# Patient Record
Sex: Female | Born: 1950
Health system: Southern US, Community
[De-identification: ages and names within clinical notes are randomized; demographics above are authoritative.]

## PROBLEM LIST (undated history)

## (undated) DIAGNOSIS — I10 Essential (primary) hypertension: Secondary | ICD-10-CM

## (undated) DIAGNOSIS — F419 Anxiety disorder, unspecified: Secondary | ICD-10-CM

## (undated) HISTORY — DX: Anxiety disorder, unspecified: F41.9

## (undated) HISTORY — PX: OTHER SURGICAL HISTORY: SHX169

## (undated) HISTORY — DX: Essential (primary) hypertension: I10

---

## 1998-06-27 ENCOUNTER — Other Ambulatory Visit: Admission: RE | Admit: 1998-06-27 | Discharge: 1998-06-27 | Payer: Self-pay | Admitting: Obstetrics and Gynecology

## 2000-04-18 ENCOUNTER — Other Ambulatory Visit: Admission: RE | Admit: 2000-04-18 | Discharge: 2000-04-18 | Payer: Self-pay | Admitting: Obstetrics and Gynecology

## 2000-05-23 ENCOUNTER — Encounter: Payer: Self-pay | Admitting: *Deleted

## 2000-05-23 ENCOUNTER — Ambulatory Visit (HOSPITAL_COMMUNITY): Admission: RE | Admit: 2000-05-23 | Discharge: 2000-05-23 | Payer: Self-pay | Admitting: *Deleted

## 2001-08-15 ENCOUNTER — Other Ambulatory Visit: Admission: RE | Admit: 2001-08-15 | Discharge: 2001-08-15 | Payer: Self-pay | Admitting: Obstetrics and Gynecology

## 2001-11-22 ENCOUNTER — Encounter: Payer: Self-pay | Admitting: *Deleted

## 2001-11-22 ENCOUNTER — Ambulatory Visit (HOSPITAL_COMMUNITY): Admission: RE | Admit: 2001-11-22 | Discharge: 2001-11-22 | Payer: Self-pay | Admitting: *Deleted

## 2003-06-02 ENCOUNTER — Emergency Department (HOSPITAL_COMMUNITY): Admission: EM | Admit: 2003-06-02 | Discharge: 2003-06-03 | Payer: Self-pay | Admitting: Emergency Medicine

## 2003-06-11 ENCOUNTER — Ambulatory Visit (HOSPITAL_COMMUNITY): Admission: RE | Admit: 2003-06-11 | Discharge: 2003-06-11 | Payer: Self-pay | Admitting: Orthopedic Surgery

## 2003-06-12 ENCOUNTER — Ambulatory Visit (HOSPITAL_COMMUNITY): Admission: RE | Admit: 2003-06-12 | Discharge: 2003-06-12 | Payer: Self-pay | Admitting: Orthopedic Surgery

## 2003-07-12 ENCOUNTER — Ambulatory Visit (HOSPITAL_COMMUNITY): Admission: RE | Admit: 2003-07-12 | Discharge: 2003-07-12 | Payer: Self-pay | Admitting: Family Medicine

## 2003-08-05 ENCOUNTER — Encounter: Admission: RE | Admit: 2003-08-05 | Discharge: 2003-08-05 | Payer: Self-pay | Admitting: Orthopaedic Surgery

## 2004-03-16 ENCOUNTER — Other Ambulatory Visit: Admission: RE | Admit: 2004-03-16 | Discharge: 2004-03-16 | Payer: Self-pay | Admitting: Obstetrics and Gynecology

## 2005-02-04 ENCOUNTER — Ambulatory Visit (HOSPITAL_COMMUNITY): Admission: RE | Admit: 2005-02-04 | Discharge: 2005-02-04 | Payer: Self-pay | Admitting: Internal Medicine

## 2005-05-13 ENCOUNTER — Other Ambulatory Visit: Admission: RE | Admit: 2005-05-13 | Discharge: 2005-05-13 | Payer: Self-pay | Admitting: Obstetrics and Gynecology

## 2006-01-20 ENCOUNTER — Ambulatory Visit (HOSPITAL_COMMUNITY): Admission: RE | Admit: 2006-01-20 | Discharge: 2006-01-20 | Payer: Self-pay | Admitting: Internal Medicine

## 2017-10-17 ENCOUNTER — Other Ambulatory Visit (HOSPITAL_COMMUNITY): Payer: Self-pay | Admitting: Internal Medicine

## 2017-10-17 ENCOUNTER — Ambulatory Visit (HOSPITAL_COMMUNITY)
Admission: RE | Admit: 2017-10-17 | Discharge: 2017-10-17 | Disposition: A | Discharge status: Home / Self Care | Payer: Medicare Other | Source: Ambulatory Visit | Attending: Family | Admitting: Family

## 2017-10-17 DIAGNOSIS — R0989 Other specified symptoms and signs involving the circulatory and respiratory systems: Secondary | ICD-10-CM

## 2018-12-14 ENCOUNTER — Other Ambulatory Visit: Payer: Self-pay

## 2018-12-14 DIAGNOSIS — Z20822 Contact with and (suspected) exposure to covid-19: Secondary | ICD-10-CM

## 2018-12-17 LAB — NOVEL CORONAVIRUS, NAA: SARS-CoV-2, NAA: NOT DETECTED

## 2019-02-17 ENCOUNTER — Ambulatory Visit: Payer: Medicare PPO | Attending: Internal Medicine

## 2019-02-17 DIAGNOSIS — Z23 Encounter for immunization: Secondary | ICD-10-CM | POA: Insufficient documentation

## 2019-02-17 NOTE — Progress Notes (Signed)
   Covid-19 Vaccination Clinic  Name:  Victoria Bradley    MRN: [id number] DOB: 06-04-1950  02/17/2019  [name] was observed post Covid-19 immunization for 15 minutes without incidence. She was provided with Vaccine Information Sheet and instruction to access the V-Safe system.   [name] was instructed to call 911 with any severe reactions post vaccine: Marland Kitchen Difficulty breathing  . Swelling of your face and throat  . A fast heartbeat  . A bad rash all over your body  . Dizziness and weakness    Immunizations Administered    Name Date Dose VIS Date Route   Pfizer COVID-19 Vaccine 02/17/2019  2:00 PM 0.3 mL 01/05/2019 Intramuscular   Manufacturer: [employer], [employer]   Lot: [id number]   NDC: [address]-2

## 2019-03-10 ENCOUNTER — Ambulatory Visit: Payer: Medicare PPO | Attending: Internal Medicine

## 2019-03-10 DIAGNOSIS — Z23 Encounter for immunization: Secondary | ICD-10-CM

## 2019-03-10 NOTE — Progress Notes (Signed)
   Covid-19 Vaccination Clinic  Name:  Victoria Bradley    MRN: [id number] DOB: 1950/12/07  03/10/2019  [name] was observed post Covid-19 immunization for 15 minutes without incidence. She was provided with Vaccine Information Sheet and instruction to access the V-Safe system.   [name] was instructed to call 911 with any severe reactions post vaccine: Marland Kitchen Difficulty breathing  . Swelling of your face and throat  . A fast heartbeat  . A bad rash all over your body  . Dizziness and weakness    Immunizations Administered    Name Date Dose VIS Date Route   Pfizer COVID-19 Vaccine 03/10/2019  1:08 PM 0.3 mL 01/05/2019 Intramuscular   Manufacturer: [employer], [employer]   Lot: [id number]   NDC: [address]-1

## 2019-04-17 ENCOUNTER — Other Ambulatory Visit: Payer: Self-pay | Admitting: Internal Medicine

## 2019-04-17 DIAGNOSIS — I1310 Hypertensive heart and chronic kidney disease without heart failure, with stage 1 through stage 4 chronic kidney disease, or unspecified chronic kidney disease: Secondary | ICD-10-CM

## 2019-04-17 DIAGNOSIS — R0789 Other chest pain: Secondary | ICD-10-CM

## 2019-05-02 ENCOUNTER — Ambulatory Visit
Admission: RE | Admit: 2019-05-02 | Discharge: 2019-05-02 | Disposition: A | Discharge status: Home / Self Care | Payer: Medicare PPO | Source: Ambulatory Visit | Attending: Internal Medicine | Admitting: Internal Medicine

## 2019-05-02 DIAGNOSIS — R0789 Other chest pain: Secondary | ICD-10-CM

## 2019-05-02 DIAGNOSIS — I1310 Hypertensive heart and chronic kidney disease without heart failure, with stage 1 through stage 4 chronic kidney disease, or unspecified chronic kidney disease: Secondary | ICD-10-CM

## 2019-05-29 DIAGNOSIS — L570 Actinic keratosis: Secondary | ICD-10-CM | POA: Diagnosis not present

## 2019-06-14 DIAGNOSIS — H9193 Unspecified hearing loss, bilateral: Secondary | ICD-10-CM | POA: Diagnosis not present

## 2019-06-14 DIAGNOSIS — H6123 Impacted cerumen, bilateral: Secondary | ICD-10-CM | POA: Diagnosis not present

## 2019-06-14 DIAGNOSIS — W57XXXA Bitten or stung by nonvenomous insect and other nonvenomous arthropods, initial encounter: Secondary | ICD-10-CM | POA: Diagnosis not present

## 2019-06-19 DIAGNOSIS — J309 Allergic rhinitis, unspecified: Secondary | ICD-10-CM | POA: Diagnosis not present

## 2019-06-19 DIAGNOSIS — H9193 Unspecified hearing loss, bilateral: Secondary | ICD-10-CM | POA: Diagnosis not present

## 2019-06-19 DIAGNOSIS — H6123 Impacted cerumen, bilateral: Secondary | ICD-10-CM | POA: Diagnosis not present

## 2019-06-20 DIAGNOSIS — H2513 Age-related nuclear cataract, bilateral: Secondary | ICD-10-CM | POA: Diagnosis not present

## 2019-06-20 DIAGNOSIS — H43811 Vitreous degeneration, right eye: Secondary | ICD-10-CM | POA: Diagnosis not present

## 2019-07-10 DIAGNOSIS — J309 Allergic rhinitis, unspecified: Secondary | ICD-10-CM | POA: Diagnosis not present

## 2019-07-10 DIAGNOSIS — J209 Acute bronchitis, unspecified: Secondary | ICD-10-CM | POA: Diagnosis not present

## 2019-07-10 DIAGNOSIS — R05 Cough: Secondary | ICD-10-CM | POA: Diagnosis not present

## 2019-09-26 DIAGNOSIS — L570 Actinic keratosis: Secondary | ICD-10-CM | POA: Diagnosis not present

## 2019-09-26 DIAGNOSIS — S81852A Open bite, left lower leg, initial encounter: Secondary | ICD-10-CM | POA: Diagnosis not present

## 2019-09-26 DIAGNOSIS — L578 Other skin changes due to chronic exposure to nonionizing radiation: Secondary | ICD-10-CM | POA: Diagnosis not present

## 2019-09-26 DIAGNOSIS — L814 Other melanin hyperpigmentation: Secondary | ICD-10-CM | POA: Diagnosis not present

## 2019-09-26 DIAGNOSIS — D225 Melanocytic nevi of trunk: Secondary | ICD-10-CM | POA: Diagnosis not present

## 2019-09-26 DIAGNOSIS — L859 Epidermal thickening, unspecified: Secondary | ICD-10-CM | POA: Diagnosis not present

## 2019-09-26 DIAGNOSIS — L219 Seborrheic dermatitis, unspecified: Secondary | ICD-10-CM | POA: Diagnosis not present

## 2019-09-26 DIAGNOSIS — L821 Other seborrheic keratosis: Secondary | ICD-10-CM | POA: Diagnosis not present

## 2019-10-15 DIAGNOSIS — R739 Hyperglycemia, unspecified: Secondary | ICD-10-CM | POA: Diagnosis not present

## 2019-10-15 DIAGNOSIS — I10 Essential (primary) hypertension: Secondary | ICD-10-CM | POA: Diagnosis not present

## 2019-10-15 DIAGNOSIS — E785 Hyperlipidemia, unspecified: Secondary | ICD-10-CM | POA: Diagnosis not present

## 2019-10-22 DIAGNOSIS — N182 Chronic kidney disease, stage 2 (mild): Secondary | ICD-10-CM | POA: Diagnosis not present

## 2019-10-22 DIAGNOSIS — R0789 Other chest pain: Secondary | ICD-10-CM | POA: Diagnosis not present

## 2019-10-22 DIAGNOSIS — R809 Proteinuria, unspecified: Secondary | ICD-10-CM | POA: Diagnosis not present

## 2019-10-22 DIAGNOSIS — E785 Hyperlipidemia, unspecified: Secondary | ICD-10-CM | POA: Diagnosis not present

## 2019-10-22 DIAGNOSIS — M25572 Pain in left ankle and joints of left foot: Secondary | ICD-10-CM | POA: Diagnosis not present

## 2019-10-22 DIAGNOSIS — R739 Hyperglycemia, unspecified: Secondary | ICD-10-CM | POA: Diagnosis not present

## 2019-10-22 DIAGNOSIS — I1310 Hypertensive heart and chronic kidney disease without heart failure, with stage 1 through stage 4 chronic kidney disease, or unspecified chronic kidney disease: Secondary | ICD-10-CM | POA: Diagnosis not present

## 2019-10-22 DIAGNOSIS — R82998 Other abnormal findings in urine: Secondary | ICD-10-CM | POA: Diagnosis not present

## 2019-10-22 DIAGNOSIS — Z0000 Encounter for general adult medical examination without abnormal findings: Secondary | ICD-10-CM | POA: Diagnosis not present

## 2019-10-22 DIAGNOSIS — M25571 Pain in right ankle and joints of right foot: Secondary | ICD-10-CM | POA: Diagnosis not present

## 2019-10-22 DIAGNOSIS — R011 Cardiac murmur, unspecified: Secondary | ICD-10-CM | POA: Diagnosis not present

## 2019-10-23 DIAGNOSIS — Z1212 Encounter for screening for malignant neoplasm of rectum: Secondary | ICD-10-CM | POA: Diagnosis not present

## 2019-11-05 DIAGNOSIS — M25571 Pain in right ankle and joints of right foot: Secondary | ICD-10-CM | POA: Diagnosis not present

## 2019-11-08 ENCOUNTER — Other Ambulatory Visit: Payer: Self-pay

## 2019-11-08 ENCOUNTER — Ambulatory Visit (HOSPITAL_COMMUNITY)
Admission: RE | Admit: 2019-11-08 | Discharge: 2019-11-08 | Disposition: A | Discharge status: Home / Self Care | Payer: Medicare PPO | Source: Ambulatory Visit | Attending: Internal Medicine | Admitting: Internal Medicine

## 2019-11-08 ENCOUNTER — Other Ambulatory Visit (HOSPITAL_COMMUNITY): Payer: Self-pay | Admitting: Internal Medicine

## 2019-11-08 DIAGNOSIS — R0989 Other specified symptoms and signs involving the circulatory and respiratory systems: Secondary | ICD-10-CM

## 2019-11-26 DIAGNOSIS — M25571 Pain in right ankle and joints of right foot: Secondary | ICD-10-CM | POA: Diagnosis not present

## 2020-04-21 DIAGNOSIS — F418 Other specified anxiety disorders: Secondary | ICD-10-CM | POA: Diagnosis not present

## 2020-04-21 DIAGNOSIS — I1310 Hypertensive heart and chronic kidney disease without heart failure, with stage 1 through stage 4 chronic kidney disease, or unspecified chronic kidney disease: Secondary | ICD-10-CM | POA: Diagnosis not present

## 2020-04-21 DIAGNOSIS — E785 Hyperlipidemia, unspecified: Secondary | ICD-10-CM | POA: Diagnosis not present

## 2020-04-21 DIAGNOSIS — R011 Cardiac murmur, unspecified: Secondary | ICD-10-CM | POA: Diagnosis not present

## 2020-04-21 DIAGNOSIS — E871 Hypo-osmolality and hyponatremia: Secondary | ICD-10-CM | POA: Diagnosis not present

## 2020-04-21 DIAGNOSIS — R809 Proteinuria, unspecified: Secondary | ICD-10-CM | POA: Diagnosis not present

## 2020-04-21 DIAGNOSIS — N182 Chronic kidney disease, stage 2 (mild): Secondary | ICD-10-CM | POA: Diagnosis not present

## 2020-04-21 DIAGNOSIS — E669 Obesity, unspecified: Secondary | ICD-10-CM | POA: Diagnosis not present

## 2020-04-21 DIAGNOSIS — R739 Hyperglycemia, unspecified: Secondary | ICD-10-CM | POA: Diagnosis not present

## 2020-07-09 DIAGNOSIS — Z1231 Encounter for screening mammogram for malignant neoplasm of breast: Secondary | ICD-10-CM | POA: Diagnosis not present

## 2020-07-09 DIAGNOSIS — Z01419 Encounter for gynecological examination (general) (routine) without abnormal findings: Secondary | ICD-10-CM | POA: Diagnosis not present

## 2020-07-09 DIAGNOSIS — Z6834 Body mass index (BMI) 34.0-34.9, adult: Secondary | ICD-10-CM | POA: Diagnosis not present

## 2020-08-19 DIAGNOSIS — H8110 Benign paroxysmal vertigo, unspecified ear: Secondary | ICD-10-CM | POA: Diagnosis not present

## 2020-09-25 DIAGNOSIS — L814 Other melanin hyperpigmentation: Secondary | ICD-10-CM | POA: Diagnosis not present

## 2020-09-25 DIAGNOSIS — L821 Other seborrheic keratosis: Secondary | ICD-10-CM | POA: Diagnosis not present

## 2020-09-25 DIAGNOSIS — D225 Melanocytic nevi of trunk: Secondary | ICD-10-CM | POA: Diagnosis not present

## 2020-09-25 DIAGNOSIS — L859 Epidermal thickening, unspecified: Secondary | ICD-10-CM | POA: Diagnosis not present

## 2020-09-25 DIAGNOSIS — L570 Actinic keratosis: Secondary | ICD-10-CM | POA: Diagnosis not present

## 2020-09-25 DIAGNOSIS — L578 Other skin changes due to chronic exposure to nonionizing radiation: Secondary | ICD-10-CM | POA: Diagnosis not present

## 2020-10-16 DIAGNOSIS — E785 Hyperlipidemia, unspecified: Secondary | ICD-10-CM | POA: Diagnosis not present

## 2020-10-16 DIAGNOSIS — R739 Hyperglycemia, unspecified: Secondary | ICD-10-CM | POA: Diagnosis not present

## 2020-10-23 DIAGNOSIS — N182 Chronic kidney disease, stage 2 (mild): Secondary | ICD-10-CM | POA: Diagnosis not present

## 2020-10-23 DIAGNOSIS — R809 Proteinuria, unspecified: Secondary | ICD-10-CM | POA: Diagnosis not present

## 2020-10-23 DIAGNOSIS — E785 Hyperlipidemia, unspecified: Secondary | ICD-10-CM | POA: Diagnosis not present

## 2020-10-23 DIAGNOSIS — R011 Cardiac murmur, unspecified: Secondary | ICD-10-CM | POA: Diagnosis not present

## 2020-10-23 DIAGNOSIS — Z23 Encounter for immunization: Secondary | ICD-10-CM | POA: Diagnosis not present

## 2020-10-23 DIAGNOSIS — I1310 Hypertensive heart and chronic kidney disease without heart failure, with stage 1 through stage 4 chronic kidney disease, or unspecified chronic kidney disease: Secondary | ICD-10-CM | POA: Diagnosis not present

## 2020-10-23 DIAGNOSIS — Z1212 Encounter for screening for malignant neoplasm of rectum: Secondary | ICD-10-CM | POA: Diagnosis not present

## 2020-10-23 DIAGNOSIS — R82998 Other abnormal findings in urine: Secondary | ICD-10-CM | POA: Diagnosis not present

## 2020-10-23 DIAGNOSIS — F418 Other specified anxiety disorders: Secondary | ICD-10-CM | POA: Diagnosis not present

## 2020-10-23 DIAGNOSIS — R5383 Other fatigue: Secondary | ICD-10-CM | POA: Diagnosis not present

## 2020-10-23 DIAGNOSIS — E669 Obesity, unspecified: Secondary | ICD-10-CM | POA: Diagnosis not present

## 2020-10-23 DIAGNOSIS — Z0000 Encounter for general adult medical examination without abnormal findings: Secondary | ICD-10-CM | POA: Diagnosis not present

## 2020-10-23 DIAGNOSIS — E871 Hypo-osmolality and hyponatremia: Secondary | ICD-10-CM | POA: Diagnosis not present

## 2020-10-23 DIAGNOSIS — H8110 Benign paroxysmal vertigo, unspecified ear: Secondary | ICD-10-CM | POA: Diagnosis not present

## 2020-11-18 DIAGNOSIS — H8110 Benign paroxysmal vertigo, unspecified ear: Secondary | ICD-10-CM | POA: Diagnosis not present

## 2020-11-18 DIAGNOSIS — I1310 Hypertensive heart and chronic kidney disease without heart failure, with stage 1 through stage 4 chronic kidney disease, or unspecified chronic kidney disease: Secondary | ICD-10-CM | POA: Diagnosis not present

## 2020-11-18 DIAGNOSIS — N182 Chronic kidney disease, stage 2 (mild): Secondary | ICD-10-CM | POA: Diagnosis not present

## 2020-11-18 DIAGNOSIS — R55 Syncope and collapse: Secondary | ICD-10-CM | POA: Diagnosis not present

## 2020-11-18 DIAGNOSIS — R739 Hyperglycemia, unspecified: Secondary | ICD-10-CM | POA: Diagnosis not present

## 2020-11-18 DIAGNOSIS — E871 Hypo-osmolality and hyponatremia: Secondary | ICD-10-CM | POA: Diagnosis not present

## 2020-11-18 DIAGNOSIS — R011 Cardiac murmur, unspecified: Secondary | ICD-10-CM | POA: Diagnosis not present

## 2020-11-19 ENCOUNTER — Other Ambulatory Visit (HOSPITAL_COMMUNITY): Payer: Self-pay | Admitting: Family Medicine

## 2020-11-19 DIAGNOSIS — R011 Cardiac murmur, unspecified: Secondary | ICD-10-CM

## 2020-11-24 DIAGNOSIS — S30861A Insect bite (nonvenomous) of abdominal wall, initial encounter: Secondary | ICD-10-CM | POA: Diagnosis not present

## 2020-11-24 DIAGNOSIS — W57XXXA Bitten or stung by nonvenomous insect and other nonvenomous arthropods, initial encounter: Secondary | ICD-10-CM | POA: Diagnosis not present

## 2020-12-03 ENCOUNTER — Ambulatory Visit (HOSPITAL_COMMUNITY): Payer: Medicare PPO | Attending: Cardiology

## 2020-12-03 ENCOUNTER — Other Ambulatory Visit: Payer: Self-pay

## 2020-12-03 DIAGNOSIS — R011 Cardiac murmur, unspecified: Secondary | ICD-10-CM | POA: Diagnosis not present

## 2020-12-03 LAB — ECHOCARDIOGRAM COMPLETE
AR max vel: 1.79 cm2
AV Area VTI: 2.09 cm2
AV Area mean vel: 1.87 cm2
AV Mean grad: 9 mmHg
AV Peak grad: 20.4 mmHg
Ao pk vel: 2.26 m/s
Area-P 1/2: 6.12 cm2
S' Lateral: 2 cm

## 2020-12-03 MED ORDER — PERFLUTREN LIPID MICROSPHERE
1.0000 mL | INTRAVENOUS | Status: AC | PRN
  Administered 2020-12-03: 3 mL via INTRAVENOUS

## 2020-12-16 DIAGNOSIS — H2513 Age-related nuclear cataract, bilateral: Secondary | ICD-10-CM | POA: Diagnosis not present

## 2020-12-16 DIAGNOSIS — H43813 Vitreous degeneration, bilateral: Secondary | ICD-10-CM | POA: Diagnosis not present

## 2020-12-31 ENCOUNTER — Ambulatory Visit: Payer: Medicare PPO | Admitting: Cardiology

## 2020-12-31 ENCOUNTER — Encounter: Payer: Self-pay | Admitting: Cardiology

## 2020-12-31 ENCOUNTER — Other Ambulatory Visit: Payer: Self-pay

## 2020-12-31 VITALS — BP 147/69 | HR 94 | Temp 98.4°F | Resp 17 | Ht 63.0 in | Wt 197.8 lb

## 2020-12-31 DIAGNOSIS — I10 Essential (primary) hypertension: Secondary | ICD-10-CM

## 2020-12-31 DIAGNOSIS — R55 Syncope and collapse: Secondary | ICD-10-CM | POA: Diagnosis not present

## 2020-12-31 MED ORDER — ATENOLOL 50 MG PO TABS: 50.0000 mg | ORAL_TABLET | Freq: Every day | ORAL | 1 refills | Status: DC

## 2020-12-31 MED ORDER — VALSARTAN 160 MG PO TABS: 160.0000 mg | ORAL_TABLET | Freq: Every day | ORAL | 2 refills | Status: DC

## 2020-12-31 NOTE — Progress Notes (Signed)
Primary Physician/Referring:  Shon Baton, MD  Patient ID: Victoria Bradley, female    DOB: Jun 04, 1950, 70 y.o.   MRN: [id number]  Chief Complaint  Patient presents with   Hypertension   Chronic Kidney Disease   New Patient (Initial Visit)   HPI:    Victoria Bradley  is a 70 y.o. female with history of hypertension, hyperlipidemia, CKD, osteoarthritis, scoliosis, history of rheumatic fever in 1974.  Patient presented to her PCP 11/18/2020 with concerns of presyncopal episode.  Patient's PCP Dr. Shon Baton subsequently ordered echocardiogram which revealed hyperdynamic LV with LVEF 70-75%, mean LVOT gradient of 9 mmHg, and grade 1 diastolic dysfunction.   Patient has had 1 episode of frank syncope several years ago.  Again 2 weeks ago while she was traveling to [place] with her relative in the car, she started feeling bright lights in her eyes, diaphoresis, lightheadedness, also thought she needed to go to the bathroom and had near syncopal spell but eventually after resting for a while the episode passed.  She is now referred to me for evaluation of the same.  Denies chest pain, dyspnea, leg edema, no history of pulmonary embolism.  Otherwise remains active.  Past Medical History:  Diagnosis Date   Hypertension    Past Surgical History:  Procedure Laterality Date   OTHER SURGICAL HISTORY Left    toe repair from arthritis   Family History  Problem Relation Age of Onset   Atrial fibrillation Mother 93   Stroke Mother 48   Esophagitis Father 58   Acute myelogenous leukemia Son        Diagnosed at 19 months old    Social History   Tobacco Use   Smoking status: Never   Smokeless tobacco: Never  Substance Use Topics   Alcohol use: Yes    Alcohol/week: 1.0 standard drink    Types: 1 Cans of beer per week    Comment: occasionally   Marital Status: Married   ROS  Review of Systems  Cardiovascular:  Positive for syncope. Negative for chest pain, dyspnea on exertion and leg  swelling.  Gastrointestinal:  Negative for melena.  Neurological:  Positive for dizziness.   Objective  Blood pressure (!) 147/69, pulse 94, temperature 98.4 F (36.9 C), temperature source Temporal, resp. rate 17, height 5\' 3"  (1.6 m), weight 197 lb 12.8 oz (89.7 kg), SpO2 98 %.  Vitals with BMI 12/31/2020  Height 5\' 3"   Weight 197 lbs 13 oz  BMI [id number]  Systolic Q000111Q  Diastolic 69  Pulse 94    Orthostatic VS for the past 72 hrs (Last 3 readings):  Orthostatic BP Patient Position BP Location Cuff Size Orthostatic Pulse  12/31/20 1141 151/77 Standing Left Arm Large 81  12/31/20 1140 151/78 Sitting Left Arm Large 78  12/31/20 1139 144/69 Supine Left Arm Large 77     Physical Exam Constitutional:      Appearance: She is obese.  Neck:     Vascular: No carotid bruit or JVD.  Cardiovascular:     Rate and Rhythm: Normal rate and regular rhythm.     Pulses: Intact distal pulses.     Heart sounds: Normal heart sounds. No murmur heard.   No gallop.  Pulmonary:     Effort: Pulmonary effort is normal.     Breath sounds: Normal breath sounds.  Abdominal:     General: Bowel sounds are normal.     Palpations: Abdomen is soft.  Musculoskeletal:  General: No swelling.    Laboratory examination:   External labs:   Cholesterol, total 211.000 m 10/16/2020 HDL 83.000 mg 10/16/2020 LDL 116.000 m 10/16/2020 Triglycerides 58.000 mg 10/16/2020  A1C 5.400 % 10/16/2020 TSH 1.880 10/16/2020  Hemoglobin 14.300 g/d 04/02/2019  Creatinine, Serum 0.700 mg/ 04/02/2019 Potassium 4.200 mEq 10/16/2020 ALT (SGPT) 30.000 IU/ 10/16/2020  Allergies   Allergies  Allergen Reactions   Penicillins Rash    Medications Prior to Visit:   Outpatient Medications Prior to Visit  Medication Sig Dispense Refill   simvastatin (ZOCOR) 40 MG tablet Take 1 tablet by mouth at bedtime.     atenolol (TENORMIN) 25 MG tablet Take 12.5 mg by mouth daily.     hydrochlorothiazide (HYDRODIURIL) 25 MG tablet Take 0.5  tablets by mouth daily.     valsartan (DIOVAN) 80 MG tablet Take 1 tablet by mouth daily.     No facility-administered medications prior to visit.   Final Medications at End of Visit    Current Meds  Medication Sig   simvastatin (ZOCOR) 40 MG tablet Take 1 tablet by mouth at bedtime.   [DISCONTINUED] atenolol (TENORMIN) 25 MG tablet Take 12.5 mg by mouth daily.   [DISCONTINUED] hydrochlorothiazide (HYDRODIURIL) 25 MG tablet Take 0.5 tablets by mouth daily.   [DISCONTINUED] valsartan (DIOVAN) 80 MG tablet Take 1 tablet by mouth daily.   Radiology:   No results found.  Cardiac Studies:   Carotid artery duplex 2019-11-25: Summary:  Velocities in the right ICA are consistent with a 1-39% stenosis.  Left Carotid: There is no evidence of stenosis in the left ICA. Vertebrals:  Bilateral vertebral arteries demonstrate antegrade flow. Subclavians: Normal flow hemodynamics were seen in bilateral subclavian arteries.  Echocardiogram 12/03/2020: 1. Hyperdynamic contraction with mean LVOT gradient of 9 mmHg (murmur). Left ventricular ejection fraction, by estimation, is 70 to 75%. The left ventricle has hyperdynamic function. The left ventricle has no regional wall motion abnormalities. Left  ventricular diastolic parameters are consistent with Grade I diastolic dysfunction (impaired relaxation).  2. Right ventricular systolic function is normal. The right ventricular size is normal.  3. The mitral valve is normal in structure. No evidence of mitral valve regurgitation. No evidence of mitral stenosis.  4. The aortic valve is tricuspid. Aortic valve regurgitation is not visualized. Mild aortic valve sclerosis is present, with no evidence of aortic valve stenosis. Aortic valve mean gradient measures 9.0 mmHg. Aortic valve Vmax measures 2.26 m/s.  5. The inferior vena cava is normal in size with greater than 50% respiratory variability, suggesting right atrial pressure of 3 mmHg.  EKG:   EKG  12/31/2020: Normal sinus rhythm at the rate of 77 bpm, normal axis, poor R wave progression, probably normal variant.  No evidence of ischemia.    Assessment     ICD-10-CM   1. Vasovagal syncope  R55     2. Primary hypertension  I10 EKG 12-Lead    atenolol (TENORMIN) 50 MG tablet    valsartan (DIOVAN) 160 MG tablet      Medications Discontinued During This Encounter  Medication Reason   hydrochlorothiazide (HYDRODIURIL) 25 MG tablet Discontinued by provider   valsartan (DIOVAN) 80 MG tablet Reorder   atenolol (TENORMIN) 25 MG tablet Reorder    Meds ordered this encounter  Medications   atenolol (TENORMIN) 50 MG tablet    Sig: Take 1 tablet (50 mg total) by mouth daily.    Dispense:  90 tablet    Refill:  1   valsartan (DIOVAN)  160 MG tablet    Sig: Take 1 tablet (160 mg total) by mouth daily.    Dispense:  30 tablet    Refill:  2   Recommendations:   Ece Cumberland is a 70 y.o. Caucasian female patient with primary hypertension, who I had last seen in January 2018 when she presented with atypical chest pain and I had recommended routine treadmill exercise stress test.  As she was very active at that time, after reassurance, she had canceled her stress test.  She is now referred to me for evaluation of near syncopal episodes.   Her symptoms of near syncope are classic for vasovagal episodes. Counterpressure maneuvers for prevention of near syncope discussed with the patient.  I would like to take her off of diuretics.  We will increase beta-blockers, she is on atenolol 12.5 mg daily started about 2 weeks ago, will increase to 50 mg daily in view of hyperdynamic LVEF and dynamic LVOT obstruction to improve her diastolic compliance.  It will also help with vasovagal episodes which is usually related to carotid hypersensitivity as well in this age group.  To improve diastolic function, will also increase valsartan to 160 mg daily.  I would like to see her back in 6 weeks for  follow-up.     Yates Decamp, MD, [facility] 12/31/2020, 1:09 PM Office: [phone] Fax: [phone] Pager: [phone]

## 2021-01-07 ENCOUNTER — Ambulatory Visit: Payer: Medicare PPO | Admitting: Neurology

## 2021-01-08 ENCOUNTER — Ambulatory Visit: Payer: Medicare PPO | Admitting: Neurology

## 2021-01-08 ENCOUNTER — Encounter: Payer: Self-pay | Admitting: Neurology

## 2021-01-08 ENCOUNTER — Other Ambulatory Visit: Payer: Self-pay

## 2021-01-08 VITALS — BP 161/83 | HR 81 | Ht 63.0 in | Wt 190.0 lb

## 2021-01-08 DIAGNOSIS — R55 Syncope and collapse: Secondary | ICD-10-CM | POA: Diagnosis not present

## 2021-01-08 NOTE — Patient Instructions (Signed)
Continue current medications  Follow up with your primary care doctor  Return if worse  

## 2021-01-08 NOTE — Progress Notes (Signed)
[facility]  PATIENT: Victoria Bradley DOB: 1950/08/23  REQUESTING CLINICIAN: Creola Corn, MD HISTORY FROM: Patient  REASON FOR VISIT: Presyncopal episode    HISTORICAL  CHIEF COMPLAINT:  Chief Complaint  Patient presents with   New Patient (Initial Visit)    NP/Paper Arther Dames MD [facility] /[facility]/? Syncope Rm 13    HISTORY OF PRESENT ILLNESS:  This is a 70 year old woman with past medical history of hypertension, hyperlipidemia, and arthritis who is presenting after a near syncopal episode.  Patient said that on the morning of October 23 she was driving to go to see his grand child play in [place].  That morning she did not have breakfast but took her blood pressure medications.  She reports driving which her daughter-in-law.  On the road she started feeling lightheaded, seeing stars and her ears started ringing, she felt queasy but did not vomit.  After a few minutes the symptoms did not go away so she stopped at a [employer] on the road.  She was able to go inside and laid down on the floor.  She did not call 911 but was able to drink some fluid and waited for 20-minutes and the symptoms subsided. She said she was sweating profusely during this time, no reported abnormal movements, no urinary incontinence.  She was able to make it and watch her grandchild play and was doing fine the rest of the day.   She did follow with a cardiologist who diagnosed her with vasovagal presyncope, and he did make some changes to her blood pressure medications. He discontinued diuretic and increase her beta-blocker.  Patient denies any additional symptoms since the change in blood pressure medication.    She reported in 2016, she had a very similar episode while at school, she was able to make it to her teachers lounge, the school nurse took her blood pressure which was 80/40, she was also noted to be dehydrated.  Patient stated symptoms resolved after  drinking lots of fluid.      OTHER MEDICAL CONDITIONS: HTN, Arthritis, HLD   REVIEW OF SYSTEMS: Full 14 system review of systems performed and negative with exception of: as noted in the HPI  ALLERGIES: Allergies  Allergen Reactions   Penicillins Rash    HOME MEDICATIONS: Outpatient Medications Prior to Visit  Medication Sig Dispense Refill   atenolol (TENORMIN) 50 MG tablet Take 1 tablet (50 mg total) by mouth daily. 90 tablet 1   Cholecalciferol (VITAMIN D3) 100000 UNIT/GM POWD Take 2 capsules po q daily     simvastatin (ZOCOR) 40 MG tablet Take 1 tablet by mouth at bedtime.     valsartan (DIOVAN) 160 MG tablet Take 1 tablet (160 mg total) by mouth daily. 30 tablet 2   No facility-administered medications prior to visit.    PAST MEDICAL HISTORY: Past Medical History:  Diagnosis Date   Hypertension     PAST SURGICAL HISTORY: Past Surgical History:  Procedure Laterality Date   OTHER SURGICAL HISTORY Left    toe repair from arthritis    FAMILY HISTORY: Family History  Problem Relation Age of Onset   Atrial fibrillation Mother 39   Stroke Mother 77   Esophagitis Father 73   Acute myelogenous leukemia Son        Diagnosed at 53 months old    SOCIAL HISTORY: Social History   Socioeconomic History   Marital status: Married    Spouse name: Not on file   Number of children:  4   Years of education: Not on file   Highest education level: Not on file  Occupational History   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, site    Comment: Retired at 6  Tobacco Use   Smoking status: Never   Smokeless tobacco: Never  Vaping Use   Vaping Use: Never used  Substance and Sexual Activity   Alcohol use: Yes    Alcohol/week: 1.0 standard drink    Types: 1 Cans of beer per week    Comment: occasionally   Drug use: Never   Sexual activity: Not on file  Other Topics Concern   Not on file  Social History Narrative   Not on file   Social Determinants of Health   Financial  Resource Strain: Not on file  Food Insecurity: Not on file  Transportation Needs: Not on file  Physical Activity: Not on file  Stress: Not on file  Social Connections: Not on file  Intimate Partner Violence: Not on file    PHYSICAL EXAM  GENERAL EXAM/CONSTITUTIONAL: Vitals:  Vitals:   01/08/21 0938  BP: (!) 161/83  Pulse: 81  Weight: 190 lb (86.2 kg)  Height: 5\' 3"  (1.6 m)   Body mass index is 33.66 kg/m. Wt Readings from Last 3 Encounters:  01/08/21 190 lb (86.2 kg)  12/31/20 197 lb 12.8 oz (89.7 kg)   Patient is in no distress; well developed, nourished and groomed; neck is supple  CARDIOVASCULAR: Examination of carotid arteries is normal; no carotid bruits Regular rate and rhythm, no murmurs Examination of peripheral vascular system by observation and palpation is normal  EYES: Pupils round and reactive to light, Visual fields full to confrontation, Extraocular movements intacts,   MUSCULOSKELETAL: Gait, strength, tone, movements noted in Neurologic exam below  NEUROLOGIC: MENTAL STATUS:  No flowsheet data found. awake, alert, oriented to person, place and time recent and remote memory intact normal attention and concentration language fluent, comprehension intact, naming intact fund of knowledge appropriate  CRANIAL NERVE:  2nd, 3rd, 4th, 6th - pupils equal and reactive to light, visual fields full to confrontation, extraocular muscles intact, no nystagmus 5th - facial sensation symmetric 7th - facial strength symmetric 8th - hearing intact 9th - palate elevates symmetrically, uvula midline 11th - shoulder shrug symmetric 12th - tongue protrusion midline  MOTOR:  normal bulk and tone, full strength in the BUE, BLE  SENSORY:  normal and symmetric to light touch, pinprick, temperature, vibration  COORDINATION:  finger-nose-finger, fine finger movements normal  REFLEXES:  deep tendon reflexes present and symmetric  GAIT/STATION:   normal  DIAGNOSTIC DATA (LABS, IMAGING, TESTING) - I reviewed patient records, labs, notes, testing and imaging myself where available.  No results found for: WBC, HGB, HCT, MCV, PLT No results found for: NA, K, CL, CO2, GLUCOSE, BUN, CREATININE, CALCIUM, PROT, ALBUMIN, AST, ALT, ALKPHOS, BILITOT, GFRNONAA, GFRAA No results found for: CHOL, HDL, LDLCALC, LDLDIRECT, TRIG, CHOLHDL No results found for: 14/07/22 No results found for: VITAMINB12 No results found for: TSH     ASSESSMENT AND PLAN  70 y.o. year old female with hypertension, hyperlipidemia and arthritis who is presenting after 1 episode of near syncope.  Patient did not have any breakfast that morning and took her BP meds which include beta-blocker and diuretics at that time.  She denies any loss of awareness or loss of consciousness.  There were no abnormal movements noted, and there was no urinary or bowel incontinence.  Again episode is likely vasovagal in nature.  She  reports no additional symptoms since her cardiologist discontinued the diuretic and increase her beta-blocker.  I advised her to follow-up with her primary care doctor and cardiologist as scheduled and to return if worse.   1. Vasovagal near-syncope     PLAN: Continue current medications  Follow up with your primary care doctor  Return if worse    No orders of the defined types were placed in this encounter.   No orders of the defined types were placed in this encounter.   Return if symptoms worsen or fail to improve.    Windell Norfolk, MD 01/08/2021, 11:27 AM  [facility] [address], [address] [place], [place] [address] [phone]

## 2021-01-22 ENCOUNTER — Telehealth: Payer: Self-pay

## 2021-01-22 NOTE — Telephone Encounter (Signed)
Continue to watch and the medication I am hopeful will prevent syncope also. If BP drops frequently too <90 to cut medication in half. Can we sent My Chart message???

## 2021-01-23 NOTE — Telephone Encounter (Signed)
Called pt to inform her about the message above

## 2021-02-12 ENCOUNTER — Encounter: Payer: Self-pay | Admitting: Cardiology

## 2021-02-12 ENCOUNTER — Other Ambulatory Visit: Payer: Self-pay

## 2021-02-12 ENCOUNTER — Ambulatory Visit: Payer: Medicare PPO | Admitting: Cardiology

## 2021-02-12 VITALS — BP 135/79 | HR 62 | Temp 97.7°F | Resp 17 | Ht 63.0 in | Wt 193.8 lb

## 2021-02-12 DIAGNOSIS — E7800 Pure hypercholesterolemia, unspecified: Secondary | ICD-10-CM

## 2021-02-12 DIAGNOSIS — R55 Syncope and collapse: Secondary | ICD-10-CM | POA: Diagnosis not present

## 2021-02-12 DIAGNOSIS — I10 Essential (primary) hypertension: Secondary | ICD-10-CM | POA: Diagnosis not present

## 2021-02-12 NOTE — Progress Notes (Signed)
Primary Physician/Referring:  Shon Baton, MD  Patient ID: Victoria Bradley, female    DOB: 25-Jul-1950, 71 y.o.   MRN: [id number]  Chief Complaint  Patient presents with   Follow-up    6 WEEKS   Hypertension   Dizziness   ORTHOSTATICS   HPI:    Victoria Bradley  is a 71 y.o. fCaucasian female patient with primary hypertension, who I had last seen in January 2018 when she presented with atypical chest pain and I had recommended routine treadmill exercise stress test.  As she was very active at that time, after reassurance, she had canceled her stress test.  She was referred back to me about 6 weeks ago for evaluation of syncope, I started her on increased dose of atenolol at 50 mg daily.  She was on 12.5 mg daily.  Denies chest pain, dyspnea, leg edema, no history of pulmonary embolism.  Otherwise remains active.  No new symptoms, she has not had any syncope.  On her last office visit had suspected vasovagal syncope and I increase the dose of the atenolol.  Past Medical History:  Diagnosis Date   Hypertension    Past Surgical History:  Procedure Laterality Date   OTHER SURGICAL HISTORY Left    toe repair from arthritis   Family History  Problem Relation Age of Onset   Atrial fibrillation Mother 63   Stroke Mother 80   Esophagitis Father 33   Acute myelogenous leukemia Son        Diagnosed at 65 months old    Social History   Tobacco Use   Smoking status: Never   Smokeless tobacco: Never  Substance Use Topics   Alcohol use: Yes    Alcohol/week: 1.0 standard drink    Types: 1 Cans of beer per week    Comment: occasionally   Marital Status: Married   ROS  Review of Systems  Cardiovascular:  Negative for chest pain, dyspnea on exertion and leg swelling.  Gastrointestinal:  Negative for melena.   Objective  Blood pressure 135/79, pulse 62, temperature 97.7 F (36.5 C), temperature source Temporal, resp. rate 17, height 5\' 3"  (1.6 m), weight 193 lb 12.8 oz (87.9 kg), SpO2 99  %.  Vitals with BMI 02/12/2021 01/08/2021 12/31/2020  Height 5\' 3"  5\' 3"  5\' 3"   Weight 193 lbs 13 oz 190 lbs 197 lbs 13 oz  BMI 34.34 [id number]  Systolic A999333 Q000111Q Q000111Q  Diastolic 79 83 69  Pulse 62 81 94    Orthostatic VS for the past 72 hrs (Last 3 readings):  Orthostatic BP Patient Position BP Location Cuff Size Orthostatic Pulse  02/12/21 1012 133/67 Standing Left Arm Normal 58  02/12/21 1011 136/57 Sitting Left Arm Normal 56  02/12/21 1010 133/54 Supine Left Arm Normal 53     Physical Exam Constitutional:      Appearance: She is obese.  Neck:     Vascular: No carotid bruit or JVD.  Cardiovascular:     Rate and Rhythm: Normal rate and regular rhythm.     Pulses: Intact distal pulses.     Heart sounds: Normal heart sounds. No murmur heard.   No gallop.  Pulmonary:     Effort: Pulmonary effort is normal.     Breath sounds: Normal breath sounds.  Abdominal:     General: Bowel sounds are normal.     Palpations: Abdomen is soft.  Musculoskeletal:     Right lower leg: No edema.     Left lower  leg: No edema.    Laboratory examination:   External labs:   Cholesterol, total 211.000 m 10/16/2020 HDL 83.000 mg 10/16/2020 LDL 116.000 m 10/16/2020 Triglycerides 58.000 mg 10/16/2020  A1C 5.400 % 10/16/2020 TSH 1.880 10/16/2020  Hemoglobin 14.300 g/d 04/02/2019  Creatinine, Serum 0.700 mg/ 04/02/2019 Potassium 4.200 mEq 10/16/2020 ALT (SGPT) 30.000 IU/ 10/16/2020  Allergies   Allergies  Allergen Reactions   Penicillins Rash    Medications Prior to Visit:   Outpatient Medications Prior to Visit  Medication Sig Dispense Refill   ALPRAZolam (XANAX) 0.5 MG tablet Take 1 tablet by mouth as needed.     cetirizine (ZYRTEC) 10 MG tablet Take 1 tablet by mouth as needed.     Cholecalciferol (VITAMIN D3) 100000 UNIT/GM POWD Take 2 capsules po q daily     fluticasone (FLONASE) 50 MCG/ACT nasal spray Place 1 spray into both nostrils as needed.     simvastatin (ZOCOR) 40 MG tablet Take  1 tablet by mouth at bedtime.     valsartan (DIOVAN) 160 MG tablet Take 1 tablet (160 mg total) by mouth daily. 30 tablet 2   atenolol (TENORMIN) 50 MG tablet Take 1 tablet (50 mg total) by mouth daily. 90 tablet 1   atenolol (TENORMIN) 25 MG tablet Take 1 tablet (25 mg total) by mouth daily.     No facility-administered medications prior to visit.   Final Medications at End of Visit    Current Meds  Medication Sig   ALPRAZolam (XANAX) 0.5 MG tablet Take 1 tablet by mouth as needed.   cetirizine (ZYRTEC) 10 MG tablet Take 1 tablet by mouth as needed.   Cholecalciferol (VITAMIN D3) 100000 UNIT/GM POWD Take 2 capsules po q daily   fluticasone (FLONASE) 50 MCG/ACT nasal spray Place 1 spray into both nostrils as needed.   simvastatin (ZOCOR) 40 MG tablet Take 1 tablet by mouth at bedtime.   valsartan (DIOVAN) 160 MG tablet Take 1 tablet (160 mg total) by mouth daily.   [DISCONTINUED] atenolol (TENORMIN) 50 MG tablet Take 1 tablet (50 mg total) by mouth daily.   Radiology:   No results found.  Cardiac Studies:   Carotid artery duplex November 26, 2019: Summary:  Velocities in the right ICA are consistent with a 1-39% stenosis.  Left Carotid: There is no evidence of stenosis in the left ICA. Vertebrals:  Bilateral vertebral arteries demonstrate antegrade flow. Subclavians: Normal flow hemodynamics were seen in bilateral subclavian arteries.  Echocardiogram 12/03/2020: 1. Hyperdynamic contraction with mean LVOT gradient of 9 mmHg (murmur). Left ventricular ejection fraction, by estimation, is 70 to 75%. The left ventricle has hyperdynamic function. The left ventricle has no regional wall motion abnormalities. Left  ventricular diastolic parameters are consistent with Grade I diastolic dysfunction (impaired relaxation).  2. Right ventricular systolic function is normal. The right ventricular size is normal.  3. The mitral valve is normal in structure. No evidence of mitral valve regurgitation. No  evidence of mitral stenosis.  4. The aortic valve is tricuspid. Aortic valve regurgitation is not visualized. Mild aortic valve sclerosis is present, with no evidence of aortic valve stenosis. Aortic valve mean gradient measures 9.0 mmHg. Aortic valve Vmax measures 2.26 m/s.  5. The inferior vena cava is normal in size with greater than 50% respiratory variability, suggesting right atrial pressure of 3 mmHg.  EKG:   EKG 12/31/2020: Normal sinus rhythm at the rate of 77 bpm, normal axis, poor R wave progression, probably normal variant.  No evidence of ischemia.  Assessment     ICD-10-CM   1. Vasovagal syncope  R55 atenolol (TENORMIN) 25 MG tablet    2. Primary hypertension  I10 atenolol (TENORMIN) 25 MG tablet    3. Hypercholesteremia  E78.00       Medications Discontinued During This Encounter  Medication Reason   atenolol (TENORMIN) 50 MG tablet      No orders of the defined types were placed in this encounter.  Recommendations:   Victoria Bradley is a 71 y.o. Caucasian female patient with primary hypertension, who I had last seen in January 2018 when she presented with atypical chest pain and I had recommended routine treadmill exercise stress test.  As she was very active at that time, after reassurance, she had canceled her stress test.  She was referred back to me about 6 weeks ago for evaluation of syncope, I started her on increased dose of atenolol at 50 mg daily.  She was on 12.5 mg daily.  She has noticed her blood pressure to be very well controlled, in fact there are episodes where her blood pressure has been very low and she felt slightly dizzy and heart rate has been low.  In view of this I will reduce the dose of atenolol from 50 mg to 25 mg daily, she will continue with valsartan 160 mg daily for hypertension control.  She is not orthostatic today, blood pressure is very well controlled.  She is also started to lose weight.  Although she has not had any ischemic  evaluation, she continues to remain active and also continues to walk on a regular basis without any chest pain or dyspnea.  Hence stress testing was not ordered.  I will see her back on a as needed basis.  Her lipids are slightly elevated with regard to LDL, if she switches taking simvastatin to evening dose, I suspect this will also improve.  She has no significant other vascular risks.  She is now a diabetic and does not smoke.   As she is remained stable, blood pressure is well controlled, she remains asymptomatic, I will see her back on a as needed basis.   Adrian Prows, MD, [facility] 02/12/2021, 10:27 AM Office: [phone] Fax: [phone] Pager: [phone]

## 2021-02-23 DIAGNOSIS — Z23 Encounter for immunization: Secondary | ICD-10-CM | POA: Diagnosis not present

## 2021-02-23 DIAGNOSIS — L570 Actinic keratosis: Secondary | ICD-10-CM | POA: Diagnosis not present

## 2021-03-17 DIAGNOSIS — H9193 Unspecified hearing loss, bilateral: Secondary | ICD-10-CM | POA: Diagnosis not present

## 2021-03-17 DIAGNOSIS — H6123 Impacted cerumen, bilateral: Secondary | ICD-10-CM | POA: Diagnosis not present

## 2021-03-17 DIAGNOSIS — I1310 Hypertensive heart and chronic kidney disease without heart failure, with stage 1 through stage 4 chronic kidney disease, or unspecified chronic kidney disease: Secondary | ICD-10-CM | POA: Diagnosis not present

## 2021-04-27 DIAGNOSIS — R809 Proteinuria, unspecified: Secondary | ICD-10-CM | POA: Diagnosis not present

## 2021-04-27 DIAGNOSIS — R011 Cardiac murmur, unspecified: Secondary | ICD-10-CM | POA: Diagnosis not present

## 2021-04-27 DIAGNOSIS — R5383 Other fatigue: Secondary | ICD-10-CM | POA: Diagnosis not present

## 2021-04-27 DIAGNOSIS — E669 Obesity, unspecified: Secondary | ICD-10-CM | POA: Diagnosis not present

## 2021-04-27 DIAGNOSIS — E785 Hyperlipidemia, unspecified: Secondary | ICD-10-CM | POA: Diagnosis not present

## 2021-04-27 DIAGNOSIS — F439 Reaction to severe stress, unspecified: Secondary | ICD-10-CM | POA: Diagnosis not present

## 2021-04-27 DIAGNOSIS — R739 Hyperglycemia, unspecified: Secondary | ICD-10-CM | POA: Diagnosis not present

## 2021-04-27 DIAGNOSIS — I1310 Hypertensive heart and chronic kidney disease without heart failure, with stage 1 through stage 4 chronic kidney disease, or unspecified chronic kidney disease: Secondary | ICD-10-CM | POA: Diagnosis not present

## 2021-04-27 DIAGNOSIS — N182 Chronic kidney disease, stage 2 (mild): Secondary | ICD-10-CM | POA: Diagnosis not present

## 2021-08-17 ENCOUNTER — Other Ambulatory Visit: Payer: Self-pay

## 2021-08-17 DIAGNOSIS — R55 Syncope and collapse: Secondary | ICD-10-CM

## 2021-08-17 DIAGNOSIS — I10 Essential (primary) hypertension: Secondary | ICD-10-CM

## 2021-08-17 MED ORDER — ATENOLOL 25 MG PO TABS: 25.0000 mg | ORAL_TABLET | Freq: Every day | ORAL | 0 refills | Status: DC

## 2021-08-17 MED ORDER — VALSARTAN 160 MG PO TABS: 160.0000 mg | ORAL_TABLET | Freq: Every day | ORAL | 0 refills | Status: DC

## 2021-08-21 ENCOUNTER — Telehealth: Payer: Self-pay

## 2021-08-21 ENCOUNTER — Encounter: Payer: Self-pay | Admitting: Cardiology

## 2021-08-21 NOTE — Telephone Encounter (Signed)
Pt called and stated that there has been some confusion regarding her atenolol and that she is unsure if she is suppose to be taking 25 mg or 50 mg. I see in her last office note that you changed it to 25 mg due to dizziness and low BP but she said you were going to discuss this with her. Please advise.

## 2021-08-21 NOTE — Telephone Encounter (Signed)
25 mg please. I sent her my chart message

## 2021-08-25 NOTE — Telephone Encounter (Signed)
Called patient, Na, LMAM.

## 2021-08-25 NOTE — Telephone Encounter (Signed)
Patient stated that she takes her Atenolol 25mg  already, around 9am and then she can feel her BP drop around 2 or so and doesn't have a an appetite. Her BP has dropped down to 88/48. She has been drinking more water to see if that helps, but wants to know if she should be concerned that it drops for a short period of time. Please advise.

## 2021-08-25 NOTE — Telephone Encounter (Signed)
If no appetite, it should help with weight loss and also blood pressure control.  I would like her to lose some weight which is very important.  She could start taking atenolol half twice daily instead of 25 mg.

## 2021-08-27 ENCOUNTER — Other Ambulatory Visit: Payer: Self-pay

## 2021-08-27 DIAGNOSIS — I10 Essential (primary) hypertension: Secondary | ICD-10-CM

## 2021-08-27 MED ORDER — VALSARTAN 80 MG PO TABS: 80.0000 mg | ORAL_TABLET | Freq: Every day | ORAL | 0 refills | Status: DC

## 2021-08-27 NOTE — Progress Notes (Signed)
Updating current med list to reflect valsartan dose intended by Dr. Jacinto Halim

## 2021-09-03 NOTE — Telephone Encounter (Signed)
Called patient, NA, LMAM

## 2021-09-08 ENCOUNTER — Telehealth: Payer: Self-pay

## 2021-09-08 NOTE — Telephone Encounter (Signed)
Patient's BP's are soft-normal at home and had a hypotensive episode today. Patient denied having hypotensive symptoms like she has previously had. Advised to hold BP meds if SBP <100.  Average Systolic BP Level 116.23 mmHg Lowest Systolic BP Level 77 mmHg Highest Systolic BP Level 143 mmHg  09/08/2021 Tuesday at 12:07 PM 77 / 50      09/07/2021 Monday at 07:27 AM 119 / 73      09/06/2021 Sunday at 08:42 AM 113 / 69      09/05/2021 Saturday at 10:50 AM 143 / 88      09/03/2021 Thursday at 09:35 AM 112 / 71      09/02/2021 Wednesday at 10:15 AM 123 / 73      09/01/2021 Tuesday at 09:38 AM 103 / 61      08/31/2021 Monday at 12:06 PM 126 / 74      08/30/2021 Sunday at 12:01 PM 129 / 75      08/29/2021 Saturday at 08:25 AM 122 / 76      08/27/2021 Thursday at 08:08 AM 105 / 66      08/26/2021 Wednesday at 09:21 AM 115 / 74      08 /01/2021 Tuesday at 07:40 AM 124 / 75

## 2021-09-08 NOTE — Telephone Encounter (Signed)
BP well controlled and hence would continue present management

## 2021-09-22 IMAGING — CT CT CHEST HIGH RESOLUTION W/O CM
1 of 4 series · 15 of 32 positions shown, 19 images · non-contrast
Comparison: None.

CLINICAL DATA: Chest pain

EXAM:
CT CHEST WITHOUT CONTRAST
TECHNIQUE: Multidetector CT imaging of the chest was performed following the
standard protocol without intravenous contrast. High resolution
imaging of the lungs, as well as inspiratory and expiratory imaging,
was performed.

[Series 2: chest · axial · 0.70mm/px · z∈[-303,-55]mm · 15 of 140 slices shown, 19 images]
[im 8/140  mediastinal]
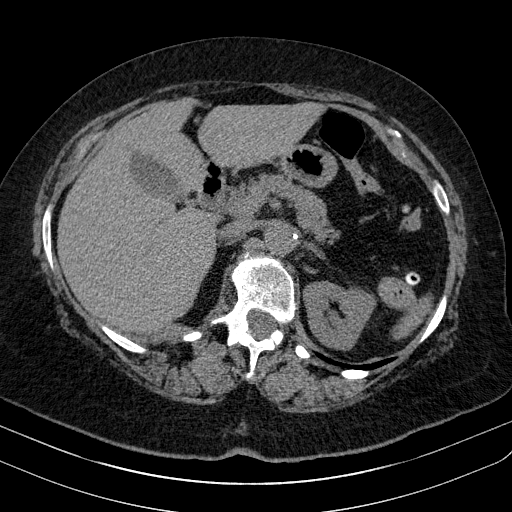
[im 8/140  lung]
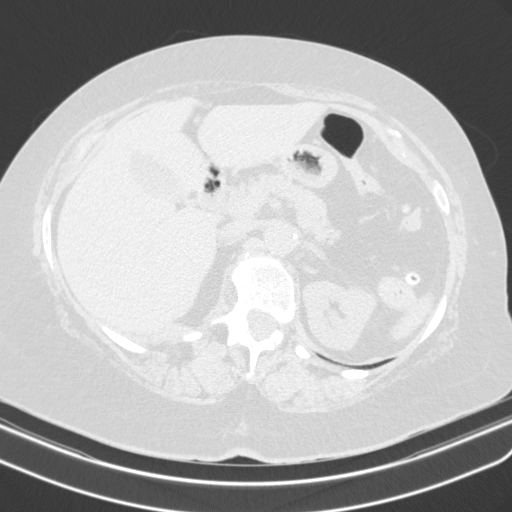
[im 15/140  lung]
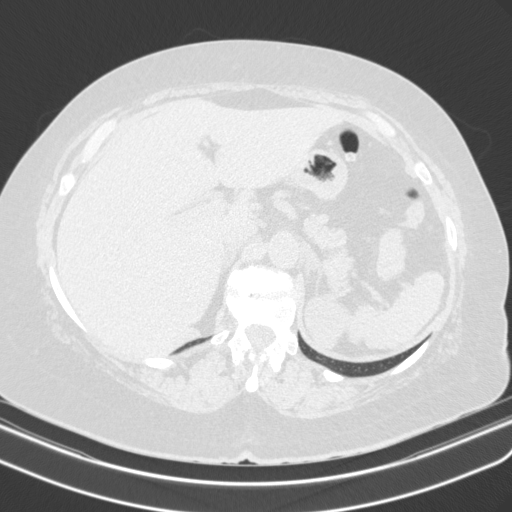
[im 30/140  lung]
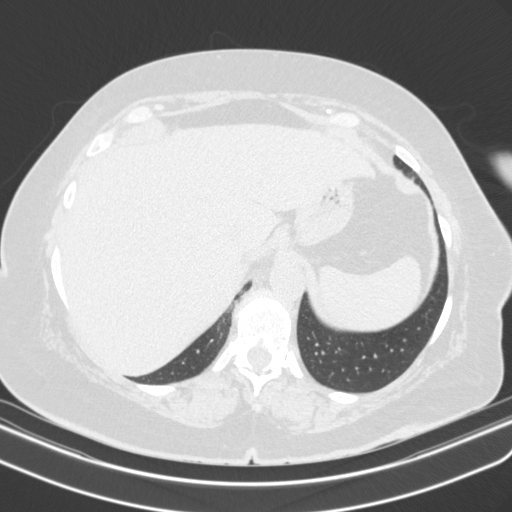
[im 37/140  lung]
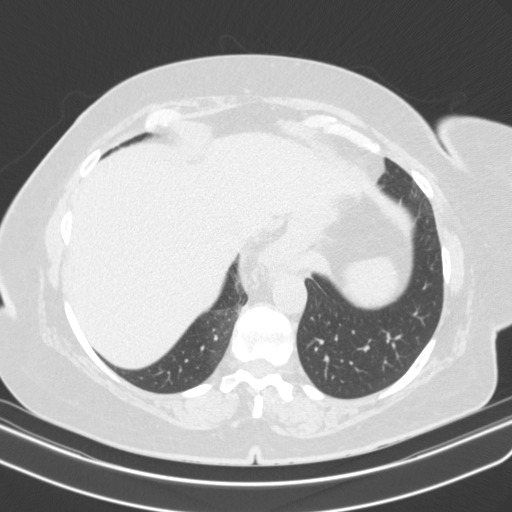
[im 44/140  mediastinal]
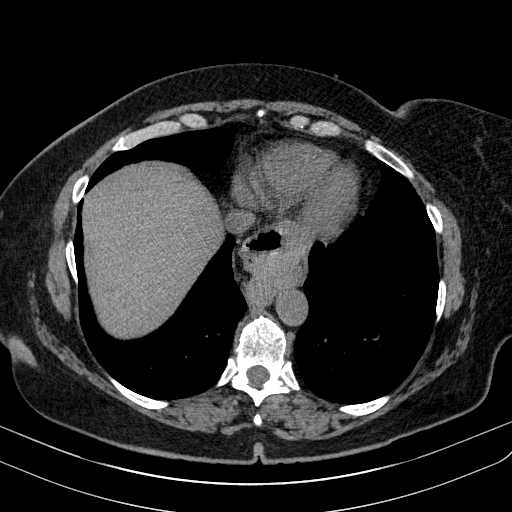
[im 44/140  lung]
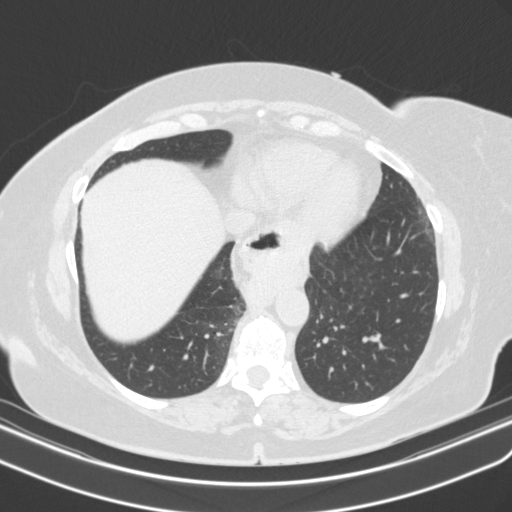
[im 52/140  lung]
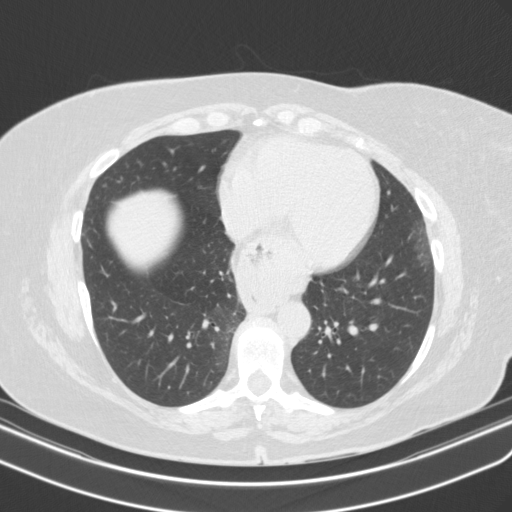
[im 66/140  lung]
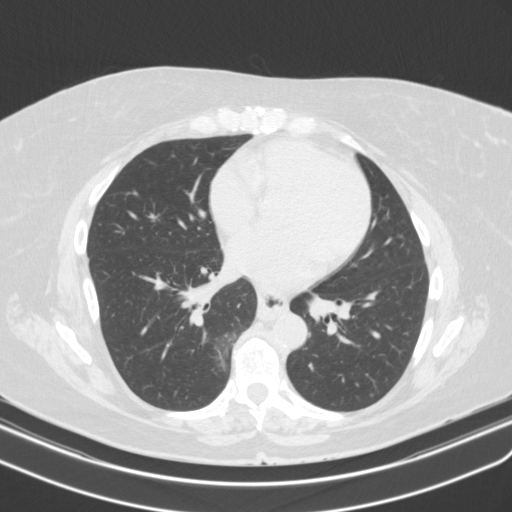
[im 70/140  lung]
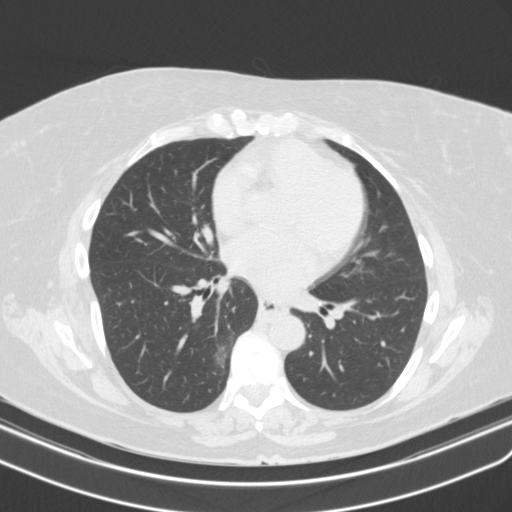
[im 74/140  mediastinal]
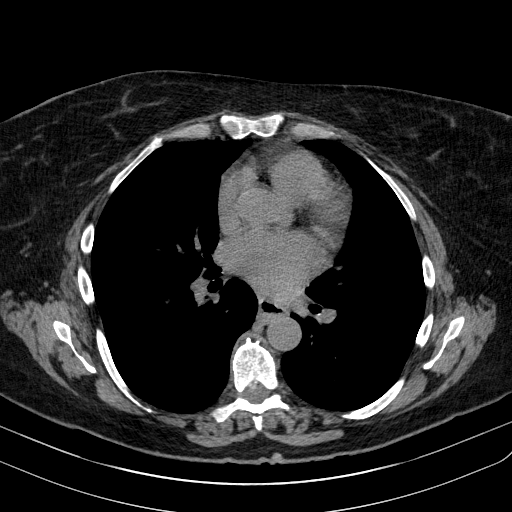
[im 74/140  lung]
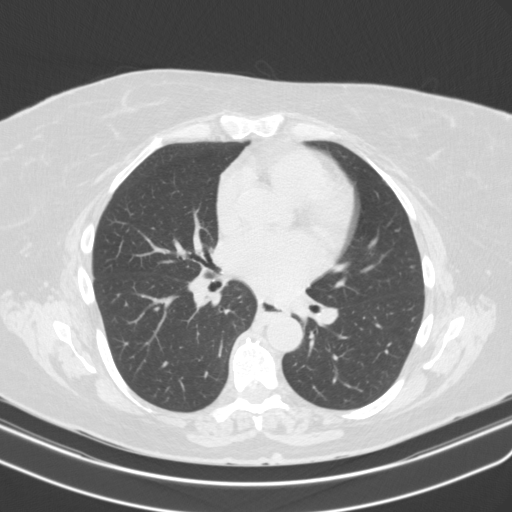
[im 88/140  lung]
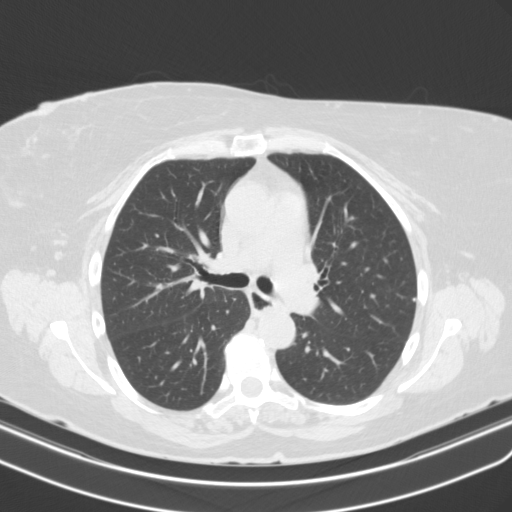
[im 96/140  lung]
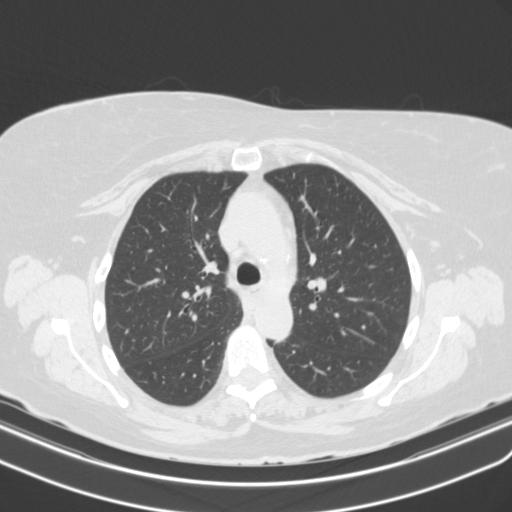
[im 103/140  lung]
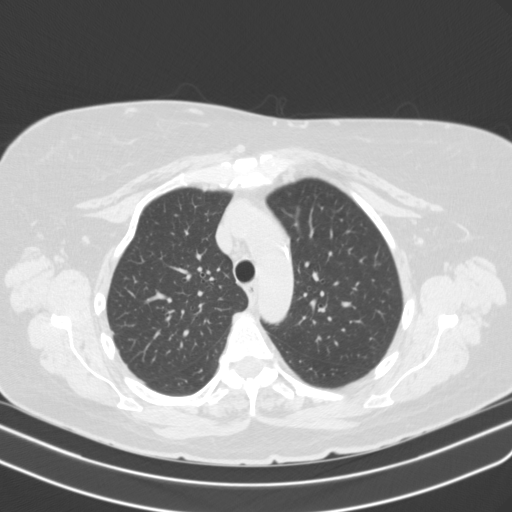
[im 110/140  mediastinal]
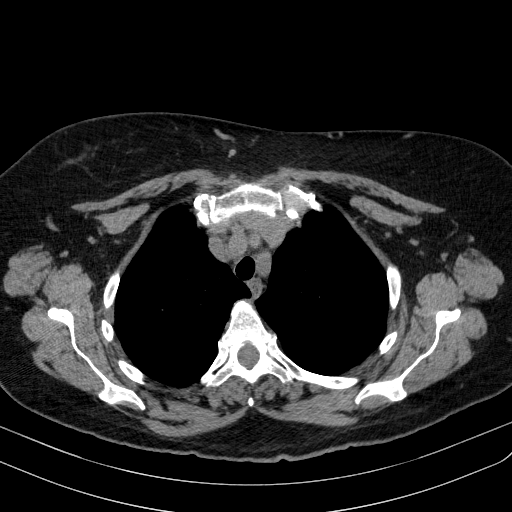
[im 110/140  lung]
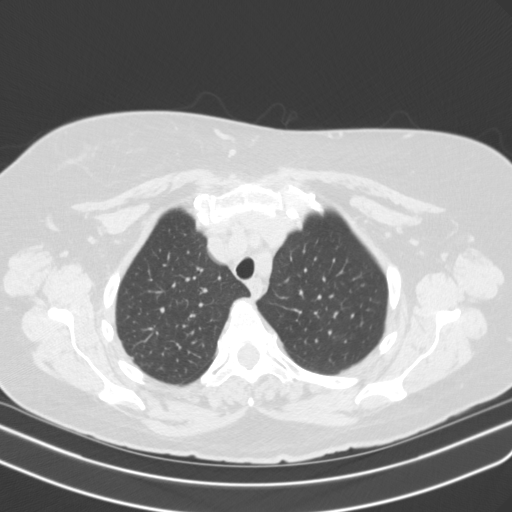
[im 125/140  lung]
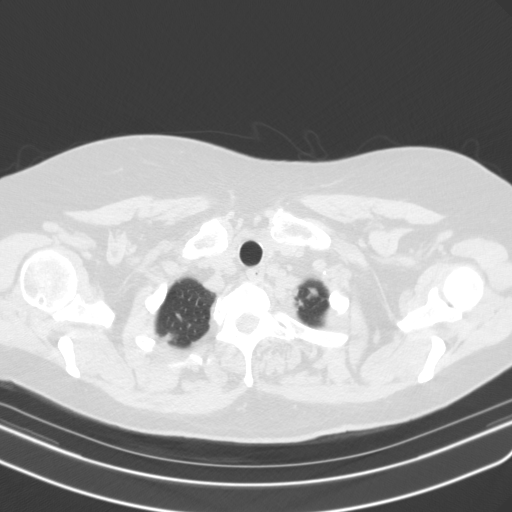
[im 132/140  lung]
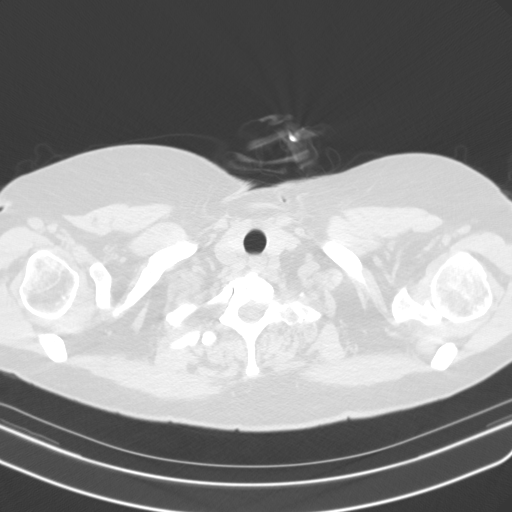

[15 of 32 positions shown; findings below may reference images not displayed]

FINDINGS: Cardiovascular: Aortic atherosclerosis. Normal heart size. No
pericardial effusion.

Mediastinum/Nodes: No enlarged mediastinal, hilar, or axillary lymph
nodes. Moderate hiatal hernia with intrathoracic position of the
gastric fundus. Thyroid gland, trachea, and esophagus demonstrate no
significant findings.

Lungs/Pleura: Minimal disc osteophyte related scarring of the medial
right lower lobe. No significant air trapping on expiratory phase
imaging. Calcified, benign small pulmonary nodule of the left
pulmonary apex. No pleural effusion or pneumothorax.

Upper Abdomen: No acute abnormality. Rim calcified 1.0 cm aneurysm
of the splenic artery or a branch vessel in the included upper
abdomen (series 2, image 122).

Musculoskeletal: No chest wall mass or suspicious bone lesions
identified. Disc degenerative disease of the thoracic spine. Chronic
fracture deformities of the anterior right upper ribs.
IMPRESSION: 1.  No definite findings to explain chest pain.

2.  Moderate hiatal hernia, which may be symptomatic.

3.  No evidence of fibrotic interstitial lung disease.

4.  Aortic Atherosclerosis ([id number]-J90.0).

## 2021-09-30 DIAGNOSIS — I10 Essential (primary) hypertension: Secondary | ICD-10-CM | POA: Diagnosis not present

## 2021-10-05 ENCOUNTER — Telehealth: Payer: Self-pay

## 2021-10-05 DIAGNOSIS — L821 Other seborrheic keratosis: Secondary | ICD-10-CM | POA: Diagnosis not present

## 2021-10-05 DIAGNOSIS — L814 Other melanin hyperpigmentation: Secondary | ICD-10-CM | POA: Diagnosis not present

## 2021-10-05 DIAGNOSIS — L570 Actinic keratosis: Secondary | ICD-10-CM | POA: Diagnosis not present

## 2021-10-05 DIAGNOSIS — L578 Other skin changes due to chronic exposure to nonionizing radiation: Secondary | ICD-10-CM | POA: Diagnosis not present

## 2021-10-05 DIAGNOSIS — D225 Melanocytic nevi of trunk: Secondary | ICD-10-CM | POA: Diagnosis not present

## 2021-10-05 DIAGNOSIS — I10 Essential (primary) hypertension: Secondary | ICD-10-CM

## 2021-10-05 MED ORDER — VALSARTAN 40 MG PO TABS: 40.0000 mg | ORAL_TABLET | Freq: Every evening | ORAL | 5 refills | Status: AC

## 2021-10-05 NOTE — Telephone Encounter (Signed)
Per Dr. Jacinto Halim and Alicia's conversation decrease in valsartan from 80 mg to 40 mg daily due to labile BPs. Patient to take 40 mg daily QPM, and continue atenolol 25 mg daily QAM. Spoke with patient to rely message, she understands. Will continue to monitor.    BP Readings: 10/05/2021 Monday at 07:57 AM 142 / 77      10/04/2021 Sunday at 06:22 PM 65 / 40      10/04/2021 Sunday at 08:08 AM 123 / 79      10/03/2021 Saturday at 08:27 PM 85 / 51      10/03/2021 Saturday at 09:09 AM 141 / 85      10/02/2021 Friday at 09:10 PM 86 / 54      10/02/2021 Friday at 05:55 PM 105 / 67      10/02/2021 Friday at 11:48 AM 112 / 71      10/02/2021 Friday at 08:14 AM 121 / 77      10/01/2021 Thursday at 08:37 PM 92 / 58      10/01/2021 Thursday at 08:34 PM 111 / 66      10/01/2021 Thursday at 02:44 PM 118 / 67      10/01/2021 Thursday at 02:41 PM 118 / 70      10/01/2021 Thursday at 02:39 PM 120 / 69      10/01/2021 Thursday at 07:56 AM 127 / 76      10/01/2021 Thursday at 07:53 AM 124 / 75      09 /07/2021 Thursday at 07:50 AM 125 / 73

## 2021-10-05 NOTE — Telephone Encounter (Signed)
Blood pressure being soft, will discontinue 80 mg valsartan and switch to 40 mg of valsartan.  Continue PCM.    ICD-10-CM   1. Primary hypertension  I10 valsartan (DIOVAN) 40 MG tablet     No orders of the defined types were placed in this encounter.   Meds ordered this encounter  Medications   valsartan (DIOVAN) 40 MG tablet    Sig: Take 1 tablet (40 mg total) by mouth every evening.    Dispense:  30 tablet    Refill:  5   Medications Discontinued During This Encounter  Medication Reason   valsartan (DIOVAN) 80 MG tablet Dose change

## 2021-10-27 DIAGNOSIS — R739 Hyperglycemia, unspecified: Secondary | ICD-10-CM | POA: Diagnosis not present

## 2021-10-27 DIAGNOSIS — R7989 Other specified abnormal findings of blood chemistry: Secondary | ICD-10-CM | POA: Diagnosis not present

## 2021-10-27 DIAGNOSIS — F418 Other specified anxiety disorders: Secondary | ICD-10-CM | POA: Diagnosis not present

## 2021-10-27 DIAGNOSIS — E785 Hyperlipidemia, unspecified: Secondary | ICD-10-CM | POA: Diagnosis not present

## 2021-10-27 DIAGNOSIS — R5383 Other fatigue: Secondary | ICD-10-CM | POA: Diagnosis not present

## 2021-10-27 DIAGNOSIS — I10 Essential (primary) hypertension: Secondary | ICD-10-CM | POA: Diagnosis not present

## 2021-10-30 DIAGNOSIS — I10 Essential (primary) hypertension: Secondary | ICD-10-CM | POA: Diagnosis not present

## 2021-11-03 DIAGNOSIS — Z0000 Encounter for general adult medical examination without abnormal findings: Secondary | ICD-10-CM | POA: Diagnosis not present

## 2021-11-03 DIAGNOSIS — Z1331 Encounter for screening for depression: Secondary | ICD-10-CM | POA: Diagnosis not present

## 2021-11-03 DIAGNOSIS — I1310 Hypertensive heart and chronic kidney disease without heart failure, with stage 1 through stage 4 chronic kidney disease, or unspecified chronic kidney disease: Secondary | ICD-10-CM | POA: Diagnosis not present

## 2021-11-03 DIAGNOSIS — R739 Hyperglycemia, unspecified: Secondary | ICD-10-CM | POA: Diagnosis not present

## 2021-11-03 DIAGNOSIS — N182 Chronic kidney disease, stage 2 (mild): Secondary | ICD-10-CM | POA: Diagnosis not present

## 2021-11-03 DIAGNOSIS — E871 Hypo-osmolality and hyponatremia: Secondary | ICD-10-CM | POA: Diagnosis not present

## 2021-11-03 DIAGNOSIS — R82998 Other abnormal findings in urine: Secondary | ICD-10-CM | POA: Diagnosis not present

## 2021-11-03 DIAGNOSIS — Z1389 Encounter for screening for other disorder: Secondary | ICD-10-CM | POA: Diagnosis not present

## 2021-11-03 DIAGNOSIS — E785 Hyperlipidemia, unspecified: Secondary | ICD-10-CM | POA: Diagnosis not present

## 2021-11-03 DIAGNOSIS — F4320 Adjustment disorder, unspecified: Secondary | ICD-10-CM | POA: Diagnosis not present

## 2021-11-03 DIAGNOSIS — E669 Obesity, unspecified: Secondary | ICD-10-CM | POA: Diagnosis not present

## 2021-11-03 DIAGNOSIS — Z23 Encounter for immunization: Secondary | ICD-10-CM | POA: Diagnosis not present

## 2021-11-03 DIAGNOSIS — M25562 Pain in left knee: Secondary | ICD-10-CM | POA: Diagnosis not present

## 2021-11-04 ENCOUNTER — Telehealth: Payer: Self-pay

## 2021-11-04 NOTE — Telephone Encounter (Signed)
Spoke with patient regarding low BP readings. She states she feels fine no sign/symptoms of hypotension. We changed her BP regimen last month to atenolol 25 mg AM and valsartan 40 mg PM, patient to hold if SBP <100.    BP readings: 11/04/2021 Wednesday at 08:41 AM 103 / 65      11/03/2021 Tuesday at 07:27 PM 71 / 43      11/03/2021 Tuesday at 07:20 PM 67 / 41      11/03/2021 Tuesday at 07:18 PM 66 / 41      11/03/2021 Tuesday at 07:15 PM 65 / 42      11/03/2021 Tuesday at 06:36 PM 112 / 68      11/03/2021 Tuesday at 04:35 PM 114 / 69      11/03/2021 Tuesday at 04:10 PM 78 / 48      11/03/2021 Tuesday at 12:36 PM 123 / 74      11/02/2021 Monday at 08:22 PM 135 / 87      11/02/2021 Monday at 08:21 PM 136 / 90      11/02/2021 Monday at 07:49 PM 133 / 83      11/02/2021 Monday at 07:47 PM 132 / 80      11/02/2021 Monday at 06:37 PM 111 / 68      11/02/2021 Monday at 06:34 PM 106 / 66      11/02/2021 Monday at 06:30 PM 106 / 68      11/02/2021 Monday at 09:43 AM 124 / 72      11/02/2021 Monday at 09:38 AM 144 / 75      11/02/2021 Monday at 09:36 AM 147 / 78      11/02/2021 Monday at 09:31 AM 132 / 84

## 2021-11-18 ENCOUNTER — Other Ambulatory Visit: Payer: Self-pay | Admitting: Cardiology

## 2021-11-18 DIAGNOSIS — Z1231 Encounter for screening mammogram for malignant neoplasm of breast: Secondary | ICD-10-CM | POA: Diagnosis not present

## 2021-11-18 DIAGNOSIS — R55 Syncope and collapse: Secondary | ICD-10-CM

## 2021-11-18 DIAGNOSIS — I10 Essential (primary) hypertension: Secondary | ICD-10-CM

## 2021-11-18 DIAGNOSIS — Z01419 Encounter for gynecological examination (general) (routine) without abnormal findings: Secondary | ICD-10-CM | POA: Diagnosis not present

## 2021-11-18 DIAGNOSIS — Z6833 Body mass index (BMI) 33.0-33.9, adult: Secondary | ICD-10-CM | POA: Diagnosis not present

## 2021-11-30 DIAGNOSIS — I10 Essential (primary) hypertension: Secondary | ICD-10-CM | POA: Diagnosis not present

## 2021-12-01 DIAGNOSIS — M25562 Pain in left knee: Secondary | ICD-10-CM | POA: Diagnosis not present

## 2021-12-21 DIAGNOSIS — H43813 Vitreous degeneration, bilateral: Secondary | ICD-10-CM | POA: Diagnosis not present

## 2021-12-21 DIAGNOSIS — H2513 Age-related nuclear cataract, bilateral: Secondary | ICD-10-CM | POA: Diagnosis not present

## 2021-12-30 DIAGNOSIS — I10 Essential (primary) hypertension: Secondary | ICD-10-CM | POA: Diagnosis not present

## 2021-12-31 DIAGNOSIS — M25562 Pain in left knee: Secondary | ICD-10-CM | POA: Diagnosis not present

## 2022-01-11 DIAGNOSIS — M25562 Pain in left knee: Secondary | ICD-10-CM | POA: Diagnosis not present

## 2022-01-22 DIAGNOSIS — M25562 Pain in left knee: Secondary | ICD-10-CM | POA: Diagnosis not present

## 2022-01-28 DIAGNOSIS — M25562 Pain in left knee: Secondary | ICD-10-CM | POA: Diagnosis not present

## 2022-01-29 DIAGNOSIS — M1712 Unilateral primary osteoarthritis, left knee: Secondary | ICD-10-CM | POA: Diagnosis not present

## 2022-01-29 DIAGNOSIS — S83232D Complex tear of medial meniscus, current injury, left knee, subsequent encounter: Secondary | ICD-10-CM | POA: Diagnosis not present

## 2022-01-30 DIAGNOSIS — I10 Essential (primary) hypertension: Secondary | ICD-10-CM | POA: Diagnosis not present

## 2022-02-08 DIAGNOSIS — M25562 Pain in left knee: Secondary | ICD-10-CM | POA: Diagnosis not present

## 2022-02-15 DIAGNOSIS — M25562 Pain in left knee: Secondary | ICD-10-CM | POA: Diagnosis not present

## 2022-02-22 DIAGNOSIS — M25562 Pain in left knee: Secondary | ICD-10-CM | POA: Diagnosis not present

## 2022-03-01 DIAGNOSIS — M1712 Unilateral primary osteoarthritis, left knee: Secondary | ICD-10-CM | POA: Diagnosis not present

## 2022-03-02 DIAGNOSIS — I10 Essential (primary) hypertension: Secondary | ICD-10-CM | POA: Diagnosis not present

## 2022-03-05 DIAGNOSIS — M25562 Pain in left knee: Secondary | ICD-10-CM | POA: Diagnosis not present

## 2022-03-12 DIAGNOSIS — M25562 Pain in left knee: Secondary | ICD-10-CM | POA: Diagnosis not present

## 2022-03-19 DIAGNOSIS — M25562 Pain in left knee: Secondary | ICD-10-CM | POA: Diagnosis not present

## 2022-03-24 DIAGNOSIS — M25562 Pain in left knee: Secondary | ICD-10-CM | POA: Diagnosis not present

## 2022-04-01 DIAGNOSIS — I10 Essential (primary) hypertension: Secondary | ICD-10-CM | POA: Diagnosis not present

## 2022-04-01 DIAGNOSIS — M25562 Pain in left knee: Secondary | ICD-10-CM | POA: Diagnosis not present

## 2022-04-08 DIAGNOSIS — M25562 Pain in left knee: Secondary | ICD-10-CM | POA: Diagnosis not present

## 2022-04-13 DIAGNOSIS — M25562 Pain in left knee: Secondary | ICD-10-CM | POA: Diagnosis not present

## 2022-04-22 DIAGNOSIS — M25562 Pain in left knee: Secondary | ICD-10-CM | POA: Diagnosis not present

## 2022-05-01 DIAGNOSIS — I10 Essential (primary) hypertension: Secondary | ICD-10-CM | POA: Diagnosis not present

## 2022-05-06 DIAGNOSIS — M25562 Pain in left knee: Secondary | ICD-10-CM | POA: Diagnosis not present

## 2022-05-20 DIAGNOSIS — I1310 Hypertensive heart and chronic kidney disease without heart failure, with stage 1 through stage 4 chronic kidney disease, or unspecified chronic kidney disease: Secondary | ICD-10-CM | POA: Diagnosis not present

## 2022-05-20 DIAGNOSIS — E785 Hyperlipidemia, unspecified: Secondary | ICD-10-CM | POA: Diagnosis not present

## 2022-05-20 DIAGNOSIS — R809 Proteinuria, unspecified: Secondary | ICD-10-CM | POA: Diagnosis not present

## 2022-05-20 DIAGNOSIS — N182 Chronic kidney disease, stage 2 (mild): Secondary | ICD-10-CM | POA: Diagnosis not present

## 2022-05-20 DIAGNOSIS — E669 Obesity, unspecified: Secondary | ICD-10-CM | POA: Diagnosis not present

## 2022-05-20 DIAGNOSIS — E871 Hypo-osmolality and hyponatremia: Secondary | ICD-10-CM | POA: Diagnosis not present

## 2022-05-20 DIAGNOSIS — F4320 Adjustment disorder, unspecified: Secondary | ICD-10-CM | POA: Diagnosis not present

## 2022-05-20 DIAGNOSIS — R5383 Other fatigue: Secondary | ICD-10-CM | POA: Diagnosis not present

## 2022-05-20 DIAGNOSIS — R739 Hyperglycemia, unspecified: Secondary | ICD-10-CM | POA: Diagnosis not present

## 2022-05-25 DIAGNOSIS — M1712 Unilateral primary osteoarthritis, left knee: Secondary | ICD-10-CM | POA: Diagnosis not present

## 2022-05-25 DIAGNOSIS — S83232D Complex tear of medial meniscus, current injury, left knee, subsequent encounter: Secondary | ICD-10-CM | POA: Diagnosis not present

## 2022-05-27 DIAGNOSIS — M25562 Pain in left knee: Secondary | ICD-10-CM | POA: Diagnosis not present

## 2022-05-31 DIAGNOSIS — I10 Essential (primary) hypertension: Secondary | ICD-10-CM | POA: Diagnosis not present

## 2022-06-08 DIAGNOSIS — M25562 Pain in left knee: Secondary | ICD-10-CM | POA: Diagnosis not present

## 2022-06-10 DIAGNOSIS — F439 Reaction to severe stress, unspecified: Secondary | ICD-10-CM | POA: Diagnosis not present

## 2022-06-10 DIAGNOSIS — R600 Localized edema: Secondary | ICD-10-CM | POA: Diagnosis not present

## 2022-06-10 DIAGNOSIS — N182 Chronic kidney disease, stage 2 (mild): Secondary | ICD-10-CM | POA: Diagnosis not present

## 2022-06-10 DIAGNOSIS — I1310 Hypertensive heart and chronic kidney disease without heart failure, with stage 1 through stage 4 chronic kidney disease, or unspecified chronic kidney disease: Secondary | ICD-10-CM | POA: Diagnosis not present

## 2022-06-10 DIAGNOSIS — E785 Hyperlipidemia, unspecified: Secondary | ICD-10-CM | POA: Diagnosis not present

## 2022-06-22 DIAGNOSIS — M25562 Pain in left knee: Secondary | ICD-10-CM | POA: Diagnosis not present

## 2022-06-29 DIAGNOSIS — L304 Erythema intertrigo: Secondary | ICD-10-CM | POA: Diagnosis not present

## 2022-06-29 DIAGNOSIS — L570 Actinic keratosis: Secondary | ICD-10-CM | POA: Diagnosis not present

## 2022-06-30 DIAGNOSIS — I10 Essential (primary) hypertension: Secondary | ICD-10-CM | POA: Diagnosis not present

## 2022-07-05 DIAGNOSIS — M25562 Pain in left knee: Secondary | ICD-10-CM | POA: Diagnosis not present

## 2022-08-03 ENCOUNTER — Other Ambulatory Visit: Payer: Self-pay | Admitting: Cardiology

## 2022-08-03 DIAGNOSIS — R55 Syncope and collapse: Secondary | ICD-10-CM

## 2022-08-03 DIAGNOSIS — I10 Essential (primary) hypertension: Secondary | ICD-10-CM

## 2022-09-22 DIAGNOSIS — M25552 Pain in left hip: Secondary | ICD-10-CM | POA: Diagnosis not present

## 2022-10-18 DIAGNOSIS — L578 Other skin changes due to chronic exposure to nonionizing radiation: Secondary | ICD-10-CM | POA: Diagnosis not present

## 2022-10-18 DIAGNOSIS — J309 Allergic rhinitis, unspecified: Secondary | ICD-10-CM | POA: Diagnosis not present

## 2022-10-18 DIAGNOSIS — F439 Reaction to severe stress, unspecified: Secondary | ICD-10-CM | POA: Diagnosis not present

## 2022-10-18 DIAGNOSIS — L814 Other melanin hyperpigmentation: Secondary | ICD-10-CM | POA: Diagnosis not present

## 2022-10-18 DIAGNOSIS — R600 Localized edema: Secondary | ICD-10-CM | POA: Diagnosis not present

## 2022-10-18 DIAGNOSIS — I1310 Hypertensive heart and chronic kidney disease without heart failure, with stage 1 through stage 4 chronic kidney disease, or unspecified chronic kidney disease: Secondary | ICD-10-CM | POA: Diagnosis not present

## 2022-10-18 DIAGNOSIS — M25552 Pain in left hip: Secondary | ICD-10-CM | POA: Diagnosis not present

## 2022-10-18 DIAGNOSIS — H9193 Unspecified hearing loss, bilateral: Secondary | ICD-10-CM | POA: Diagnosis not present

## 2022-10-18 DIAGNOSIS — H6123 Impacted cerumen, bilateral: Secondary | ICD-10-CM | POA: Diagnosis not present

## 2022-10-18 DIAGNOSIS — N182 Chronic kidney disease, stage 2 (mild): Secondary | ICD-10-CM | POA: Diagnosis not present

## 2022-10-18 DIAGNOSIS — D225 Melanocytic nevi of trunk: Secondary | ICD-10-CM | POA: Diagnosis not present

## 2022-10-18 DIAGNOSIS — L821 Other seborrheic keratosis: Secondary | ICD-10-CM | POA: Diagnosis not present

## 2022-10-18 DIAGNOSIS — H8110 Benign paroxysmal vertigo, unspecified ear: Secondary | ICD-10-CM | POA: Diagnosis not present

## 2022-10-18 DIAGNOSIS — L570 Actinic keratosis: Secondary | ICD-10-CM | POA: Diagnosis not present

## 2022-11-09 DIAGNOSIS — E785 Hyperlipidemia, unspecified: Secondary | ICD-10-CM | POA: Diagnosis not present

## 2022-11-09 DIAGNOSIS — Z1389 Encounter for screening for other disorder: Secondary | ICD-10-CM | POA: Diagnosis not present

## 2022-11-09 DIAGNOSIS — I10 Essential (primary) hypertension: Secondary | ICD-10-CM | POA: Diagnosis not present

## 2022-11-09 DIAGNOSIS — R739 Hyperglycemia, unspecified: Secondary | ICD-10-CM | POA: Diagnosis not present

## 2022-11-16 DIAGNOSIS — Z1331 Encounter for screening for depression: Secondary | ICD-10-CM | POA: Diagnosis not present

## 2022-11-16 DIAGNOSIS — I1310 Hypertensive heart and chronic kidney disease without heart failure, with stage 1 through stage 4 chronic kidney disease, or unspecified chronic kidney disease: Secondary | ICD-10-CM | POA: Diagnosis not present

## 2022-11-16 DIAGNOSIS — I10 Essential (primary) hypertension: Secondary | ICD-10-CM | POA: Diagnosis not present

## 2022-11-16 DIAGNOSIS — R82998 Other abnormal findings in urine: Secondary | ICD-10-CM | POA: Diagnosis not present

## 2022-11-16 DIAGNOSIS — H6123 Impacted cerumen, bilateral: Secondary | ICD-10-CM | POA: Diagnosis not present

## 2022-11-16 DIAGNOSIS — E785 Hyperlipidemia, unspecified: Secondary | ICD-10-CM | POA: Diagnosis not present

## 2022-11-16 DIAGNOSIS — R0989 Other specified symptoms and signs involving the circulatory and respiratory systems: Secondary | ICD-10-CM | POA: Diagnosis not present

## 2022-11-16 DIAGNOSIS — Z1339 Encounter for screening examination for other mental health and behavioral disorders: Secondary | ICD-10-CM | POA: Diagnosis not present

## 2022-11-16 DIAGNOSIS — J309 Allergic rhinitis, unspecified: Secondary | ICD-10-CM | POA: Diagnosis not present

## 2022-11-16 DIAGNOSIS — M65312 Trigger thumb, left thumb: Secondary | ICD-10-CM | POA: Diagnosis not present

## 2022-11-16 DIAGNOSIS — M25562 Pain in left knee: Secondary | ICD-10-CM | POA: Diagnosis not present

## 2022-11-16 DIAGNOSIS — Z1389 Encounter for screening for other disorder: Secondary | ICD-10-CM | POA: Diagnosis not present

## 2022-11-16 DIAGNOSIS — R5383 Other fatigue: Secondary | ICD-10-CM | POA: Diagnosis not present

## 2022-11-16 DIAGNOSIS — Z0000 Encounter for general adult medical examination without abnormal findings: Secondary | ICD-10-CM | POA: Diagnosis not present

## 2022-11-16 DIAGNOSIS — R21 Rash and other nonspecific skin eruption: Secondary | ICD-10-CM | POA: Diagnosis not present

## 2022-11-16 DIAGNOSIS — H8110 Benign paroxysmal vertigo, unspecified ear: Secondary | ICD-10-CM | POA: Diagnosis not present

## 2022-11-16 DIAGNOSIS — M65311 Trigger thumb, right thumb: Secondary | ICD-10-CM | POA: Diagnosis not present

## 2022-11-16 DIAGNOSIS — N182 Chronic kidney disease, stage 2 (mild): Secondary | ICD-10-CM | POA: Diagnosis not present

## 2022-11-16 DIAGNOSIS — Z23 Encounter for immunization: Secondary | ICD-10-CM | POA: Diagnosis not present

## 2022-11-16 DIAGNOSIS — R739 Hyperglycemia, unspecified: Secondary | ICD-10-CM | POA: Diagnosis not present

## 2023-01-04 DIAGNOSIS — R058 Other specified cough: Secondary | ICD-10-CM | POA: Diagnosis not present

## 2023-01-04 DIAGNOSIS — J069 Acute upper respiratory infection, unspecified: Secondary | ICD-10-CM | POA: Diagnosis not present

## 2023-01-27 DIAGNOSIS — Z01419 Encounter for gynecological examination (general) (routine) without abnormal findings: Secondary | ICD-10-CM | POA: Diagnosis not present

## 2023-01-27 DIAGNOSIS — Z1231 Encounter for screening mammogram for malignant neoplasm of breast: Secondary | ICD-10-CM | POA: Diagnosis not present

## 2023-01-27 DIAGNOSIS — Z6838 Body mass index (BMI) 38.0-38.9, adult: Secondary | ICD-10-CM | POA: Diagnosis not present

## 2023-01-27 DIAGNOSIS — N959 Unspecified menopausal and perimenopausal disorder: Secondary | ICD-10-CM | POA: Diagnosis not present

## 2023-02-09 DIAGNOSIS — M549 Dorsalgia, unspecified: Secondary | ICD-10-CM | POA: Diagnosis not present

## 2023-02-09 DIAGNOSIS — M545 Low back pain, unspecified: Secondary | ICD-10-CM | POA: Diagnosis not present

## 2023-02-09 DIAGNOSIS — M25552 Pain in left hip: Secondary | ICD-10-CM | POA: Diagnosis not present

## 2023-02-09 DIAGNOSIS — M4316 Spondylolisthesis, lumbar region: Secondary | ICD-10-CM | POA: Diagnosis not present

## 2023-02-14 DIAGNOSIS — M259 Joint disorder, unspecified: Secondary | ICD-10-CM | POA: Diagnosis not present

## 2023-02-14 DIAGNOSIS — M25552 Pain in left hip: Secondary | ICD-10-CM | POA: Diagnosis not present

## 2023-02-14 DIAGNOSIS — M6281 Muscle weakness (generalized): Secondary | ICD-10-CM | POA: Diagnosis not present

## 2023-02-16 DIAGNOSIS — M259 Joint disorder, unspecified: Secondary | ICD-10-CM | POA: Diagnosis not present

## 2023-02-16 DIAGNOSIS — M6281 Muscle weakness (generalized): Secondary | ICD-10-CM | POA: Diagnosis not present

## 2023-02-16 DIAGNOSIS — M25552 Pain in left hip: Secondary | ICD-10-CM | POA: Diagnosis not present

## 2023-02-21 DIAGNOSIS — M259 Joint disorder, unspecified: Secondary | ICD-10-CM | POA: Diagnosis not present

## 2023-02-21 DIAGNOSIS — M25552 Pain in left hip: Secondary | ICD-10-CM | POA: Diagnosis not present

## 2023-02-21 DIAGNOSIS — M6281 Muscle weakness (generalized): Secondary | ICD-10-CM | POA: Diagnosis not present

## 2023-02-23 DIAGNOSIS — M25552 Pain in left hip: Secondary | ICD-10-CM | POA: Diagnosis not present

## 2023-02-23 DIAGNOSIS — M6281 Muscle weakness (generalized): Secondary | ICD-10-CM | POA: Diagnosis not present

## 2023-02-23 DIAGNOSIS — M259 Joint disorder, unspecified: Secondary | ICD-10-CM | POA: Diagnosis not present

## 2023-02-28 DIAGNOSIS — M25552 Pain in left hip: Secondary | ICD-10-CM | POA: Diagnosis not present

## 2023-02-28 DIAGNOSIS — M6281 Muscle weakness (generalized): Secondary | ICD-10-CM | POA: Diagnosis not present

## 2023-02-28 DIAGNOSIS — M259 Joint disorder, unspecified: Secondary | ICD-10-CM | POA: Diagnosis not present

## 2023-03-01 DIAGNOSIS — H2513 Age-related nuclear cataract, bilateral: Secondary | ICD-10-CM | POA: Diagnosis not present

## 2023-03-01 DIAGNOSIS — H43813 Vitreous degeneration, bilateral: Secondary | ICD-10-CM | POA: Diagnosis not present

## 2023-03-02 DIAGNOSIS — M259 Joint disorder, unspecified: Secondary | ICD-10-CM | POA: Diagnosis not present

## 2023-03-02 DIAGNOSIS — M6281 Muscle weakness (generalized): Secondary | ICD-10-CM | POA: Diagnosis not present

## 2023-03-02 DIAGNOSIS — M25552 Pain in left hip: Secondary | ICD-10-CM | POA: Diagnosis not present

## 2023-03-07 DIAGNOSIS — M6281 Muscle weakness (generalized): Secondary | ICD-10-CM | POA: Diagnosis not present

## 2023-03-07 DIAGNOSIS — M25552 Pain in left hip: Secondary | ICD-10-CM | POA: Diagnosis not present

## 2023-03-07 DIAGNOSIS — M259 Joint disorder, unspecified: Secondary | ICD-10-CM | POA: Diagnosis not present

## 2023-03-09 DIAGNOSIS — M25552 Pain in left hip: Secondary | ICD-10-CM | POA: Diagnosis not present

## 2023-03-09 DIAGNOSIS — M259 Joint disorder, unspecified: Secondary | ICD-10-CM | POA: Diagnosis not present

## 2023-03-09 DIAGNOSIS — M6281 Muscle weakness (generalized): Secondary | ICD-10-CM | POA: Diagnosis not present

## 2023-03-14 DIAGNOSIS — M25552 Pain in left hip: Secondary | ICD-10-CM | POA: Diagnosis not present

## 2023-03-14 DIAGNOSIS — M259 Joint disorder, unspecified: Secondary | ICD-10-CM | POA: Diagnosis not present

## 2023-03-14 DIAGNOSIS — M6281 Muscle weakness (generalized): Secondary | ICD-10-CM | POA: Diagnosis not present

## 2023-03-21 DIAGNOSIS — M25552 Pain in left hip: Secondary | ICD-10-CM | POA: Diagnosis not present

## 2023-03-21 DIAGNOSIS — M6281 Muscle weakness (generalized): Secondary | ICD-10-CM | POA: Diagnosis not present

## 2023-03-21 DIAGNOSIS — M259 Joint disorder, unspecified: Secondary | ICD-10-CM | POA: Diagnosis not present

## 2023-03-22 DIAGNOSIS — M25551 Pain in right hip: Secondary | ICD-10-CM | POA: Diagnosis not present

## 2023-03-22 DIAGNOSIS — M545 Low back pain, unspecified: Secondary | ICD-10-CM | POA: Diagnosis not present

## 2023-03-22 DIAGNOSIS — M25552 Pain in left hip: Secondary | ICD-10-CM | POA: Diagnosis not present

## 2023-03-23 DIAGNOSIS — M259 Joint disorder, unspecified: Secondary | ICD-10-CM | POA: Diagnosis not present

## 2023-03-23 DIAGNOSIS — M25552 Pain in left hip: Secondary | ICD-10-CM | POA: Diagnosis not present

## 2023-03-23 DIAGNOSIS — M6281 Muscle weakness (generalized): Secondary | ICD-10-CM | POA: Diagnosis not present

## 2023-03-29 DIAGNOSIS — M25552 Pain in left hip: Secondary | ICD-10-CM | POA: Diagnosis not present

## 2023-03-29 DIAGNOSIS — M6281 Muscle weakness (generalized): Secondary | ICD-10-CM | POA: Diagnosis not present

## 2023-03-29 DIAGNOSIS — M259 Joint disorder, unspecified: Secondary | ICD-10-CM | POA: Diagnosis not present

## 2023-04-01 DIAGNOSIS — M6281 Muscle weakness (generalized): Secondary | ICD-10-CM | POA: Diagnosis not present

## 2023-04-01 DIAGNOSIS — M259 Joint disorder, unspecified: Secondary | ICD-10-CM | POA: Diagnosis not present

## 2023-04-01 DIAGNOSIS — M25552 Pain in left hip: Secondary | ICD-10-CM | POA: Diagnosis not present

## 2023-04-05 DIAGNOSIS — Z09 Encounter for follow-up examination after completed treatment for conditions other than malignant neoplasm: Secondary | ICD-10-CM | POA: Diagnosis not present

## 2023-04-05 DIAGNOSIS — K5730 Diverticulosis of large intestine without perforation or abscess without bleeding: Secondary | ICD-10-CM | POA: Diagnosis not present

## 2023-04-05 DIAGNOSIS — Z860101 Personal history of adenomatous and serrated colon polyps: Secondary | ICD-10-CM | POA: Diagnosis not present

## 2023-04-05 DIAGNOSIS — D124 Benign neoplasm of descending colon: Secondary | ICD-10-CM | POA: Diagnosis not present

## 2023-04-07 DIAGNOSIS — M6281 Muscle weakness (generalized): Secondary | ICD-10-CM | POA: Diagnosis not present

## 2023-04-07 DIAGNOSIS — M25552 Pain in left hip: Secondary | ICD-10-CM | POA: Diagnosis not present

## 2023-04-07 DIAGNOSIS — M259 Joint disorder, unspecified: Secondary | ICD-10-CM | POA: Diagnosis not present

## 2023-04-08 DIAGNOSIS — M25552 Pain in left hip: Secondary | ICD-10-CM | POA: Diagnosis not present

## 2023-04-08 DIAGNOSIS — M259 Joint disorder, unspecified: Secondary | ICD-10-CM | POA: Diagnosis not present

## 2023-04-08 DIAGNOSIS — M6281 Muscle weakness (generalized): Secondary | ICD-10-CM | POA: Diagnosis not present

## 2023-04-12 DIAGNOSIS — M259 Joint disorder, unspecified: Secondary | ICD-10-CM | POA: Diagnosis not present

## 2023-04-12 DIAGNOSIS — M6281 Muscle weakness (generalized): Secondary | ICD-10-CM | POA: Diagnosis not present

## 2023-04-12 DIAGNOSIS — M25552 Pain in left hip: Secondary | ICD-10-CM | POA: Diagnosis not present

## 2023-04-15 DIAGNOSIS — M25552 Pain in left hip: Secondary | ICD-10-CM | POA: Diagnosis not present

## 2023-04-15 DIAGNOSIS — M6281 Muscle weakness (generalized): Secondary | ICD-10-CM | POA: Diagnosis not present

## 2023-04-15 DIAGNOSIS — M259 Joint disorder, unspecified: Secondary | ICD-10-CM | POA: Diagnosis not present

## 2023-04-19 DIAGNOSIS — M25552 Pain in left hip: Secondary | ICD-10-CM | POA: Diagnosis not present

## 2023-04-19 DIAGNOSIS — M259 Joint disorder, unspecified: Secondary | ICD-10-CM | POA: Diagnosis not present

## 2023-04-19 DIAGNOSIS — M6281 Muscle weakness (generalized): Secondary | ICD-10-CM | POA: Diagnosis not present

## 2023-04-22 DIAGNOSIS — M259 Joint disorder, unspecified: Secondary | ICD-10-CM | POA: Diagnosis not present

## 2023-04-22 DIAGNOSIS — M6281 Muscle weakness (generalized): Secondary | ICD-10-CM | POA: Diagnosis not present

## 2023-04-22 DIAGNOSIS — M25552 Pain in left hip: Secondary | ICD-10-CM | POA: Diagnosis not present

## 2023-04-26 DIAGNOSIS — M259 Joint disorder, unspecified: Secondary | ICD-10-CM | POA: Diagnosis not present

## 2023-04-26 DIAGNOSIS — M6281 Muscle weakness (generalized): Secondary | ICD-10-CM | POA: Diagnosis not present

## 2023-04-26 DIAGNOSIS — M25552 Pain in left hip: Secondary | ICD-10-CM | POA: Diagnosis not present

## 2023-04-29 DIAGNOSIS — M25552 Pain in left hip: Secondary | ICD-10-CM | POA: Diagnosis not present

## 2023-04-29 DIAGNOSIS — M6281 Muscle weakness (generalized): Secondary | ICD-10-CM | POA: Diagnosis not present

## 2023-04-29 DIAGNOSIS — M259 Joint disorder, unspecified: Secondary | ICD-10-CM | POA: Diagnosis not present

## 2023-05-03 DIAGNOSIS — M259 Joint disorder, unspecified: Secondary | ICD-10-CM | POA: Diagnosis not present

## 2023-05-03 DIAGNOSIS — M6281 Muscle weakness (generalized): Secondary | ICD-10-CM | POA: Diagnosis not present

## 2023-05-03 DIAGNOSIS — M25552 Pain in left hip: Secondary | ICD-10-CM | POA: Diagnosis not present

## 2023-05-06 DIAGNOSIS — M259 Joint disorder, unspecified: Secondary | ICD-10-CM | POA: Diagnosis not present

## 2023-05-06 DIAGNOSIS — M25552 Pain in left hip: Secondary | ICD-10-CM | POA: Diagnosis not present

## 2023-05-06 DIAGNOSIS — M6281 Muscle weakness (generalized): Secondary | ICD-10-CM | POA: Diagnosis not present

## 2023-05-10 DIAGNOSIS — M259 Joint disorder, unspecified: Secondary | ICD-10-CM | POA: Diagnosis not present

## 2023-05-10 DIAGNOSIS — M6281 Muscle weakness (generalized): Secondary | ICD-10-CM | POA: Diagnosis not present

## 2023-05-10 DIAGNOSIS — M25552 Pain in left hip: Secondary | ICD-10-CM | POA: Diagnosis not present

## 2023-05-13 DIAGNOSIS — M6281 Muscle weakness (generalized): Secondary | ICD-10-CM | POA: Diagnosis not present

## 2023-05-13 DIAGNOSIS — M259 Joint disorder, unspecified: Secondary | ICD-10-CM | POA: Diagnosis not present

## 2023-05-13 DIAGNOSIS — M25552 Pain in left hip: Secondary | ICD-10-CM | POA: Diagnosis not present

## 2023-05-17 DIAGNOSIS — M6281 Muscle weakness (generalized): Secondary | ICD-10-CM | POA: Diagnosis not present

## 2023-05-17 DIAGNOSIS — M4316 Spondylolisthesis, lumbar region: Secondary | ICD-10-CM | POA: Diagnosis not present

## 2023-05-17 DIAGNOSIS — M25552 Pain in left hip: Secondary | ICD-10-CM | POA: Diagnosis not present

## 2023-05-17 DIAGNOSIS — M259 Joint disorder, unspecified: Secondary | ICD-10-CM | POA: Diagnosis not present

## 2023-05-20 DIAGNOSIS — M25552 Pain in left hip: Secondary | ICD-10-CM | POA: Diagnosis not present

## 2023-05-20 DIAGNOSIS — M259 Joint disorder, unspecified: Secondary | ICD-10-CM | POA: Diagnosis not present

## 2023-05-20 DIAGNOSIS — M6281 Muscle weakness (generalized): Secondary | ICD-10-CM | POA: Diagnosis not present

## 2023-05-27 DIAGNOSIS — M6281 Muscle weakness (generalized): Secondary | ICD-10-CM | POA: Diagnosis not present

## 2023-05-27 DIAGNOSIS — M25552 Pain in left hip: Secondary | ICD-10-CM | POA: Diagnosis not present

## 2023-05-27 DIAGNOSIS — M259 Joint disorder, unspecified: Secondary | ICD-10-CM | POA: Diagnosis not present

## 2023-05-31 DIAGNOSIS — M25552 Pain in left hip: Secondary | ICD-10-CM | POA: Diagnosis not present

## 2023-05-31 DIAGNOSIS — M259 Joint disorder, unspecified: Secondary | ICD-10-CM | POA: Diagnosis not present

## 2023-05-31 DIAGNOSIS — M6281 Muscle weakness (generalized): Secondary | ICD-10-CM | POA: Diagnosis not present

## 2023-06-03 DIAGNOSIS — M259 Joint disorder, unspecified: Secondary | ICD-10-CM | POA: Diagnosis not present

## 2023-06-03 DIAGNOSIS — M25552 Pain in left hip: Secondary | ICD-10-CM | POA: Diagnosis not present

## 2023-06-03 DIAGNOSIS — M6281 Muscle weakness (generalized): Secondary | ICD-10-CM | POA: Diagnosis not present

## 2023-06-07 DIAGNOSIS — M6281 Muscle weakness (generalized): Secondary | ICD-10-CM | POA: Diagnosis not present

## 2023-06-07 DIAGNOSIS — M25552 Pain in left hip: Secondary | ICD-10-CM | POA: Diagnosis not present

## 2023-06-07 DIAGNOSIS — M259 Joint disorder, unspecified: Secondary | ICD-10-CM | POA: Diagnosis not present

## 2023-06-09 DIAGNOSIS — I1310 Hypertensive heart and chronic kidney disease without heart failure, with stage 1 through stage 4 chronic kidney disease, or unspecified chronic kidney disease: Secondary | ICD-10-CM | POA: Diagnosis not present

## 2023-06-09 DIAGNOSIS — Z780 Asymptomatic menopausal state: Secondary | ICD-10-CM | POA: Diagnosis not present

## 2023-06-09 DIAGNOSIS — E669 Obesity, unspecified: Secondary | ICD-10-CM | POA: Diagnosis not present

## 2023-06-09 DIAGNOSIS — I129 Hypertensive chronic kidney disease with stage 1 through stage 4 chronic kidney disease, or unspecified chronic kidney disease: Secondary | ICD-10-CM | POA: Diagnosis not present

## 2023-06-09 DIAGNOSIS — R739 Hyperglycemia, unspecified: Secondary | ICD-10-CM | POA: Diagnosis not present

## 2023-06-09 DIAGNOSIS — R809 Proteinuria, unspecified: Secondary | ICD-10-CM | POA: Diagnosis not present

## 2023-06-09 DIAGNOSIS — F410 Panic disorder [episodic paroxysmal anxiety] without agoraphobia: Secondary | ICD-10-CM | POA: Diagnosis not present

## 2023-06-09 DIAGNOSIS — F4320 Adjustment disorder, unspecified: Secondary | ICD-10-CM | POA: Diagnosis not present

## 2023-06-09 DIAGNOSIS — N182 Chronic kidney disease, stage 2 (mild): Secondary | ICD-10-CM | POA: Diagnosis not present

## 2023-06-09 DIAGNOSIS — R5383 Other fatigue: Secondary | ICD-10-CM | POA: Diagnosis not present

## 2023-06-09 DIAGNOSIS — E785 Hyperlipidemia, unspecified: Secondary | ICD-10-CM | POA: Diagnosis not present

## 2023-06-09 DIAGNOSIS — E871 Hypo-osmolality and hyponatremia: Secondary | ICD-10-CM | POA: Diagnosis not present

## 2023-06-10 DIAGNOSIS — M6281 Muscle weakness (generalized): Secondary | ICD-10-CM | POA: Diagnosis not present

## 2023-06-10 DIAGNOSIS — M25552 Pain in left hip: Secondary | ICD-10-CM | POA: Diagnosis not present

## 2023-06-10 DIAGNOSIS — M259 Joint disorder, unspecified: Secondary | ICD-10-CM | POA: Diagnosis not present

## 2023-06-14 DIAGNOSIS — M259 Joint disorder, unspecified: Secondary | ICD-10-CM | POA: Diagnosis not present

## 2023-06-14 DIAGNOSIS — M6281 Muscle weakness (generalized): Secondary | ICD-10-CM | POA: Diagnosis not present

## 2023-06-14 DIAGNOSIS — M25552 Pain in left hip: Secondary | ICD-10-CM | POA: Diagnosis not present

## 2023-06-21 DIAGNOSIS — M259 Joint disorder, unspecified: Secondary | ICD-10-CM | POA: Diagnosis not present

## 2023-06-21 DIAGNOSIS — M25552 Pain in left hip: Secondary | ICD-10-CM | POA: Diagnosis not present

## 2023-06-21 DIAGNOSIS — M6281 Muscle weakness (generalized): Secondary | ICD-10-CM | POA: Diagnosis not present

## 2023-06-28 DIAGNOSIS — M6281 Muscle weakness (generalized): Secondary | ICD-10-CM | POA: Diagnosis not present

## 2023-06-28 DIAGNOSIS — M25552 Pain in left hip: Secondary | ICD-10-CM | POA: Diagnosis not present

## 2023-06-28 DIAGNOSIS — M259 Joint disorder, unspecified: Secondary | ICD-10-CM | POA: Diagnosis not present

## 2023-06-29 DIAGNOSIS — N182 Chronic kidney disease, stage 2 (mild): Secondary | ICD-10-CM | POA: Diagnosis not present

## 2023-06-29 DIAGNOSIS — E785 Hyperlipidemia, unspecified: Secondary | ICD-10-CM | POA: Diagnosis not present

## 2023-06-29 DIAGNOSIS — R739 Hyperglycemia, unspecified: Secondary | ICD-10-CM | POA: Diagnosis not present

## 2023-06-29 DIAGNOSIS — R5383 Other fatigue: Secondary | ICD-10-CM | POA: Diagnosis not present

## 2023-06-29 DIAGNOSIS — I129 Hypertensive chronic kidney disease with stage 1 through stage 4 chronic kidney disease, or unspecified chronic kidney disease: Secondary | ICD-10-CM | POA: Diagnosis not present

## 2023-07-01 DIAGNOSIS — R7989 Other specified abnormal findings of blood chemistry: Secondary | ICD-10-CM | POA: Diagnosis not present

## 2023-07-05 DIAGNOSIS — M259 Joint disorder, unspecified: Secondary | ICD-10-CM | POA: Diagnosis not present

## 2023-07-05 DIAGNOSIS — M6281 Muscle weakness (generalized): Secondary | ICD-10-CM | POA: Diagnosis not present

## 2023-07-05 DIAGNOSIS — M25552 Pain in left hip: Secondary | ICD-10-CM | POA: Diagnosis not present

## 2023-07-11 DIAGNOSIS — N182 Chronic kidney disease, stage 2 (mild): Secondary | ICD-10-CM | POA: Diagnosis not present

## 2023-07-11 DIAGNOSIS — I129 Hypertensive chronic kidney disease with stage 1 through stage 4 chronic kidney disease, or unspecified chronic kidney disease: Secondary | ICD-10-CM | POA: Diagnosis not present

## 2023-07-11 DIAGNOSIS — E785 Hyperlipidemia, unspecified: Secondary | ICD-10-CM | POA: Diagnosis not present

## 2023-07-11 DIAGNOSIS — Z79899 Other long term (current) drug therapy: Secondary | ICD-10-CM | POA: Diagnosis not present

## 2023-07-19 DIAGNOSIS — M259 Joint disorder, unspecified: Secondary | ICD-10-CM | POA: Diagnosis not present

## 2023-07-19 DIAGNOSIS — M6281 Muscle weakness (generalized): Secondary | ICD-10-CM | POA: Diagnosis not present

## 2023-07-19 DIAGNOSIS — M25552 Pain in left hip: Secondary | ICD-10-CM | POA: Diagnosis not present

## 2023-07-22 ENCOUNTER — Encounter: Payer: Self-pay | Admitting: Physician Assistant

## 2023-07-22 ENCOUNTER — Ambulatory Visit (INDEPENDENT_AMBULATORY_CARE_PROVIDER_SITE_OTHER): Admitting: Physician Assistant

## 2023-07-22 VITALS — BP 158/94 | HR 75 | Ht 63.0 in | Wt 207.0 lb

## 2023-07-22 DIAGNOSIS — F411 Generalized anxiety disorder: Secondary | ICD-10-CM

## 2023-07-22 DIAGNOSIS — F410 Panic disorder [episodic paroxysmal anxiety] without agoraphobia: Secondary | ICD-10-CM

## 2023-07-22 DIAGNOSIS — F32A Depression, unspecified: Secondary | ICD-10-CM

## 2023-07-22 DIAGNOSIS — F4310 Post-traumatic stress disorder, unspecified: Secondary | ICD-10-CM

## 2023-07-22 MED ORDER — ESCITALOPRAM OXALATE 5 MG PO TABS: 5.0000 mg | ORAL_TABLET | Freq: Every day | ORAL | 1 refills | Status: DC

## 2023-07-22 MED ORDER — ALPRAZOLAM 0.5 MG PO TABS: 0.5000 mg | ORAL_TABLET | Freq: Two times a day (BID) | ORAL | 1 refills | Status: DC | PRN
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
## 2023-08-02 DIAGNOSIS — M259 Joint disorder, unspecified: Secondary | ICD-10-CM | POA: Diagnosis not present

## 2023-08-02 DIAGNOSIS — M25552 Pain in left hip: Secondary | ICD-10-CM | POA: Diagnosis not present

## 2023-08-02 DIAGNOSIS — M6281 Muscle weakness (generalized): Secondary | ICD-10-CM | POA: Diagnosis not present

## 2023-08-09 DIAGNOSIS — M6281 Muscle weakness (generalized): Secondary | ICD-10-CM | POA: Diagnosis not present

## 2023-08-09 DIAGNOSIS — M25552 Pain in left hip: Secondary | ICD-10-CM | POA: Diagnosis not present

## 2023-08-09 DIAGNOSIS — M259 Joint disorder, unspecified: Secondary | ICD-10-CM | POA: Diagnosis not present

## 2023-08-14 ENCOUNTER — Other Ambulatory Visit: Payer: Self-pay | Admitting: Physician Assistant

## 2023-08-16 DIAGNOSIS — M5416 Radiculopathy, lumbar region: Secondary | ICD-10-CM | POA: Diagnosis not present

## 2023-08-16 DIAGNOSIS — M25552 Pain in left hip: Secondary | ICD-10-CM | POA: Diagnosis not present

## 2023-08-16 DIAGNOSIS — M25562 Pain in left knee: Secondary | ICD-10-CM | POA: Diagnosis not present

## 2023-08-16 DIAGNOSIS — Z1330 Encounter for screening examination for mental health and behavioral disorders, unspecified: Secondary | ICD-10-CM | POA: Diagnosis not present

## 2023-08-16 DIAGNOSIS — M160 Bilateral primary osteoarthritis of hip: Secondary | ICD-10-CM | POA: Diagnosis not present

## 2023-08-16 DIAGNOSIS — G8929 Other chronic pain: Secondary | ICD-10-CM | POA: Diagnosis not present

## 2023-08-16 DIAGNOSIS — M25551 Pain in right hip: Secondary | ICD-10-CM | POA: Diagnosis not present

## 2023-08-16 DIAGNOSIS — M6281 Muscle weakness (generalized): Secondary | ICD-10-CM | POA: Diagnosis not present

## 2023-08-16 DIAGNOSIS — M259 Joint disorder, unspecified: Secondary | ICD-10-CM | POA: Diagnosis not present

## 2023-08-22 DIAGNOSIS — R600 Localized edema: Secondary | ICD-10-CM | POA: Diagnosis not present

## 2023-08-22 DIAGNOSIS — E871 Hypo-osmolality and hyponatremia: Secondary | ICD-10-CM | POA: Diagnosis not present

## 2023-08-22 DIAGNOSIS — N182 Chronic kidney disease, stage 2 (mild): Secondary | ICD-10-CM | POA: Diagnosis not present

## 2023-08-22 DIAGNOSIS — F439 Reaction to severe stress, unspecified: Secondary | ICD-10-CM | POA: Diagnosis not present

## 2023-08-22 DIAGNOSIS — I129 Hypertensive chronic kidney disease with stage 1 through stage 4 chronic kidney disease, or unspecified chronic kidney disease: Secondary | ICD-10-CM | POA: Diagnosis not present

## 2023-08-22 DIAGNOSIS — I1310 Hypertensive heart and chronic kidney disease without heart failure, with stage 1 through stage 4 chronic kidney disease, or unspecified chronic kidney disease: Secondary | ICD-10-CM | POA: Diagnosis not present

## 2023-08-22 DIAGNOSIS — E669 Obesity, unspecified: Secondary | ICD-10-CM | POA: Diagnosis not present

## 2023-08-23 DIAGNOSIS — M259 Joint disorder, unspecified: Secondary | ICD-10-CM | POA: Diagnosis not present

## 2023-08-23 DIAGNOSIS — M6281 Muscle weakness (generalized): Secondary | ICD-10-CM | POA: Diagnosis not present

## 2023-08-23 DIAGNOSIS — M25552 Pain in left hip: Secondary | ICD-10-CM | POA: Diagnosis not present

## 2023-08-30 DIAGNOSIS — M6281 Muscle weakness (generalized): Secondary | ICD-10-CM | POA: Diagnosis not present

## 2023-08-30 DIAGNOSIS — M25552 Pain in left hip: Secondary | ICD-10-CM | POA: Diagnosis not present

## 2023-08-30 DIAGNOSIS — M259 Joint disorder, unspecified: Secondary | ICD-10-CM | POA: Diagnosis not present

## 2023-09-02 ENCOUNTER — Other Ambulatory Visit (HOSPITAL_COMMUNITY): Payer: Self-pay | Admitting: Internal Medicine

## 2023-09-02 ENCOUNTER — Ambulatory Visit (INDEPENDENT_AMBULATORY_CARE_PROVIDER_SITE_OTHER): Admitting: Physician Assistant

## 2023-09-02 ENCOUNTER — Encounter: Payer: Self-pay | Admitting: Physician Assistant

## 2023-09-02 ENCOUNTER — Ambulatory Visit (HOSPITAL_COMMUNITY)
Admission: RE | Admit: 2023-09-02 | Discharge: 2023-09-02 | Disposition: A | Discharge status: Home / Self Care | Source: Ambulatory Visit | Attending: Vascular Surgery | Admitting: Vascular Surgery

## 2023-09-02 DIAGNOSIS — F411 Generalized anxiety disorder: Secondary | ICD-10-CM

## 2023-09-02 DIAGNOSIS — F410 Panic disorder [episodic paroxysmal anxiety] without agoraphobia: Secondary | ICD-10-CM | POA: Diagnosis not present

## 2023-09-02 DIAGNOSIS — F32A Depression, unspecified: Secondary | ICD-10-CM

## 2023-09-02 DIAGNOSIS — I998 Other disorder of circulatory system: Secondary | ICD-10-CM | POA: Diagnosis not present

## 2023-09-02 DIAGNOSIS — I10 Essential (primary) hypertension: Secondary | ICD-10-CM

## 2023-09-02 DIAGNOSIS — F4310 Post-traumatic stress disorder, unspecified: Secondary | ICD-10-CM | POA: Diagnosis not present

## 2023-09-02 MED ORDER — ESCITALOPRAM OXALATE 10 MG PO TABS: 10.0000 mg | ORAL_TABLET | Freq: Every day | ORAL | 0 refills | Status: DC

## 2023-09-02 NOTE — Progress Notes (Signed)
 [facility]  Patient ID: Victoria Bradley,  MRN: [id number]  PCP: Onita Rush, MD  Date of Evaluation: 09/02/2023 Time spent:25 minutes  Chief Complaint:  Chief Complaint   Anxiety; Depression; Follow-up    HISTORY/CURRENT STATUS: HPI For 6 week med check.   PA aren't happening as frequently since starting the Lexapro . She takes the Xanax  prn which helps when needed. No confusion, imbalance, or falls that could be from the Xanax .  Has started walking and eating better, joined the [employer]. Her BP has been elevated, she's seen her PCP about it. She is checking her BPs  bid and she had a renal US  this morning.  Na level has improved, PCP is following that too.  Still feels a little sometimes.  But it is not common.  She reports wanting to do things more than she did, such as cooking or Bible study.  Also spending time with friends.  ADLs and personal hygiene are normal.  Does not cry easily.  No feelings of hopelessness.  Appetite is normal.  No mania, delirium, psychosis, or suicidal/homicidal thoughts.  Individual Medical History/ Review of Systems: Changes? :No     Past medications for mental health diagnoses include: Xanax  for dental work only, Mirtazapine to help appetite but she doesn't take it  No hosp for psych reasons  Allergies: Penicillins  Current Medications:  Current Outpatient Medications:    ALPRAZolam  (XANAX ) 0.5 MG tablet, Take 1 tablet (0.5 mg total) by mouth 2 (two) times daily as needed., Disp: 30 tablet, Rfl: 1   atenolol  (TENORMIN ) 25 MG tablet, TAKE 1 TABLET (25 MG TOTAL) BY MOUTH DAILY., Disp: 90 tablet, Rfl: 0   escitalopram  (LEXAPRO ) 10 MG tablet, Take 1 tablet (10 mg total) by mouth daily., Disp: 90 tablet, Rfl: 0   mirtazapine (REMERON) 15 MG tablet, TAKE 1 TABLET BY MOUTH AT BEDTIME ORALLY AS DIRECTED 90 DAYS, Disp: , Rfl:    simvastatin (ZOCOR) 40 MG tablet, Take 1 tablet by mouth at bedtime., Disp: , Rfl:    valsartan  (DIOVAN ) 40 MG tablet, Take 1  tablet (40 mg total) by mouth every evening., Disp: 30 tablet, Rfl: 5   cetirizine (ZYRTEC) 10 MG tablet, Take 1 tablet by mouth as needed. (Patient not taking: Reported on 07/22/2023), Disp: , Rfl:    Cholecalciferol (VITAMIN D3) 100000 UNIT/GM POWD, Take 2 capsules po q daily, Disp: , Rfl:    fluticasone (FLONASE) 50 MCG/ACT nasal spray, Place 1 spray into both nostrils as needed. (Patient not taking: Reported on 07/22/2023), Disp: , Rfl:  Medication Side Effects: none  Family Medical/ Social History: Changes? No  MENTAL HEALTH EXAM:  There were no vitals taken for this visit.There is no height or weight on file to calculate BMI.  General Appearance: Casual, Well Groomed, and Obese  Eye Contact:  Good  Speech:  Clear and Coherent and Normal Rate  Volume:  Normal  Mood:  Euthymic  Affect:  Congruent  Thought Process:  Goal Directed and Descriptions of Associations: Circumstantial  Orientation:  Full (Time, Place, and Person)  Thought Content: Logical   Suicidal Thoughts:  No  Homicidal Thoughts:  No  Memory:  WNL  Judgement:  Good  Insight:  Good  Psychomotor Activity:  Normal  Concentration:  Concentration: Good  Recall:  Good  Fund of Knowledge: Good  Language: Good  Assets:  Communication Skills Desire for Improvement Financial Resources/Insurance Housing Transportation  ADL's:  Intact  Cognition: WNL  Prognosis:  Good   DIAGNOSES:  ICD-10-CM   1. Generalized anxiety disorder  F41.1     2. Panic attacks  F41.0     3. PTSD (post-traumatic stress disorder)  F43.10     4. Mild depression  F32.A       Receiving Psychotherapy: Yes   with Dr. Glean Major  RECOMMENDATIONS:   PDMP reviewed.  Xanax  filled 07/22/2023. I provided approximately 25 minutes of face to face time during this encounter, including time spent before and after the visit in records review, medical decision making, counseling pertinent to today's visit, and charting.   I am glad to see her  doing better but I believe increasing the dose of Lexapro  will help her even more.  She agrees.  Continue Xanax  0.5 mg, 1 p.o. twice daily as needed. Increase Lexapro  to 10 mg daily. Continue mirtazapine 15 mg, 1 p.o. nightly as needed sleep. Continue therapy with Dr. Ragan. Return in 6 to 8 weeks.  Verneita Cooks, PA-C

## 2023-09-08 DIAGNOSIS — M25552 Pain in left hip: Secondary | ICD-10-CM | POA: Diagnosis not present

## 2023-09-08 DIAGNOSIS — M259 Joint disorder, unspecified: Secondary | ICD-10-CM | POA: Diagnosis not present

## 2023-09-08 DIAGNOSIS — M6281 Muscle weakness (generalized): Secondary | ICD-10-CM | POA: Diagnosis not present

## 2023-09-13 ENCOUNTER — Other Ambulatory Visit: Payer: Self-pay | Admitting: Physician Assistant

## 2023-09-13 DIAGNOSIS — M25552 Pain in left hip: Secondary | ICD-10-CM | POA: Diagnosis not present

## 2023-09-13 DIAGNOSIS — M259 Joint disorder, unspecified: Secondary | ICD-10-CM | POA: Diagnosis not present

## 2023-09-13 DIAGNOSIS — M6281 Muscle weakness (generalized): Secondary | ICD-10-CM | POA: Diagnosis not present

## 2023-09-20 DIAGNOSIS — M6281 Muscle weakness (generalized): Secondary | ICD-10-CM | POA: Diagnosis not present

## 2023-09-20 DIAGNOSIS — M259 Joint disorder, unspecified: Secondary | ICD-10-CM | POA: Diagnosis not present

## 2023-09-20 DIAGNOSIS — M25552 Pain in left hip: Secondary | ICD-10-CM | POA: Diagnosis not present

## 2023-10-14 ENCOUNTER — Ambulatory Visit (INDEPENDENT_AMBULATORY_CARE_PROVIDER_SITE_OTHER): Admitting: Physician Assistant

## 2023-10-14 ENCOUNTER — Encounter: Payer: Self-pay | Admitting: Physician Assistant

## 2023-10-14 DIAGNOSIS — F411 Generalized anxiety disorder: Secondary | ICD-10-CM

## 2023-10-14 DIAGNOSIS — F4310 Post-traumatic stress disorder, unspecified: Secondary | ICD-10-CM | POA: Diagnosis not present

## 2023-10-14 DIAGNOSIS — F410 Panic disorder [episodic paroxysmal anxiety] without agoraphobia: Secondary | ICD-10-CM | POA: Diagnosis not present

## 2023-10-14 DIAGNOSIS — F32A Depression, unspecified: Secondary | ICD-10-CM | POA: Diagnosis not present

## 2023-10-14 NOTE — Progress Notes (Signed)
 [facility]  Patient ID: Victoria Bradley,  MRN: [id number]  PCP: Onita Rush, MD  Date of Evaluation: 10/14/2023 Time spent:20 minutes  Chief Complaint:  Chief Complaint   Anxiety; Depression; Follow-up    HISTORY/CURRENT STATUS: HPI For 6 week med check.   Lexapro  was increased at the LOV.  Feels like it's working better.  Had one PA since our LOV. Still gets overwhelmed at times but doesn't feel like any med changes need to be made. She is able to enjoy things.  Has started going to the [facility].  Is going to her 55th high school reunion soon and looking forward to that. Energy and motivation are good.  No extreme sadness, tearfulness, or feelings of hopelessness.  Sleeps ok with the Mirtazapine.  ADLs and personal hygiene are normal.   Denies any changes in concentration, making decisions, or remembering things.  Appetite has not changed.  Weight is stable.   No mania, delirium, AH/VH.  No SI/HI.  Individual Medical History/ Review of Systems: Changes? :Yes  she recently tripped on a rug and fell, no major injury.  She already has meniscus issue on left, it was sore for a few days after th fall but better now. Says she didn't turn the light on b/c she didn't want to disturb her dog, and that's what caused the fall. Had GI bug since LOV, better now.   Past medications for mental health diagnoses include: Xanax  for dental work only, Mirtazapine to help appetite but she doesn't take it  No hosp for psych reasons  Allergies: Penicillins  Current Medications:  Current Outpatient Medications:    ALPRAZolam  (XANAX ) 0.5 MG tablet, Take 1 tablet (0.5 mg total) by mouth 2 (two) times daily as needed., Disp: 30 tablet, Rfl: 1   atenolol  (TENORMIN ) 25 MG tablet, TAKE 1 TABLET (25 MG TOTAL) BY MOUTH DAILY., Disp: 90 tablet, Rfl: 0   Cholecalciferol (VITAMIN D3) 100000 UNIT/GM POWD, Take 2 capsules po q daily, Disp: , Rfl:    escitalopram  (LEXAPRO ) 10 MG tablet, Take 1 tablet (10 mg  total) by mouth daily., Disp: 90 tablet, Rfl: 0   mirtazapine (REMERON) 15 MG tablet, TAKE 1 TABLET BY MOUTH AT BEDTIME ORALLY AS DIRECTED 90 DAYS, Disp: , Rfl:    simvastatin (ZOCOR) 40 MG tablet, Take 1 tablet by mouth at bedtime., Disp: , Rfl:    valsartan  (DIOVAN ) 40 MG tablet, Take 1 tablet (40 mg total) by mouth every evening., Disp: 30 tablet, Rfl: 5   cetirizine (ZYRTEC) 10 MG tablet, Take 1 tablet by mouth as needed. (Patient not taking: Reported on 07/22/2023), Disp: , Rfl:    fluticasone (FLONASE) 50 MCG/ACT nasal spray, Place 1 spray into both nostrils as needed. (Patient not taking: Reported on 07/22/2023), Disp: , Rfl:  Medication Side Effects: none  Family Medical/ Social History: Changes? No  MENTAL HEALTH EXAM:  There were no vitals taken for this visit.There is no height or weight on file to calculate BMI.  General Appearance: Casual, Well Groomed, and Obese  Eye Contact:  Good  Speech:  Clear and Coherent, Normal Rate, and talkative which is her normal  Volume:  Normal  Mood:  Euthymic  Affect:  Congruent  Thought Process:  Goal Directed and Descriptions of Associations: Circumstantial  Orientation:  Full (Time, Place, and Person)  Thought Content: Logical   Suicidal Thoughts:  No  Homicidal Thoughts:  No  Memory:  WNL  Judgement:  Good  Insight:  Good  Psychomotor Activity:  Normal  Concentration:  Concentration: Good  Recall:  Good  Fund of Knowledge: Good  Language: Good  Assets:  Communication Skills Desire for Improvement Financial Resources/Insurance Housing Transportation  ADL's:  Intact  Cognition: WNL  Prognosis:  Good   DIAGNOSES:    ICD-10-CM   1. Generalized anxiety disorder  F41.1     2. Panic attacks  F41.0     3. PTSD (post-traumatic stress disorder)  F43.10     4. Mild depression  F32.A      Receiving Psychotherapy: Yes   with Dr. Glean Major  RECOMMENDATIONS:   PDMP reviewed.  Xanax  filled 07/22/2023. I provided approximately  20  minutes of face to face time during this encounter, including time spent before and after the visit in records review, medical decision making, counseling pertinent to today's visit, and charting.   She's doing well so no changes need to be made.  We disc the Xanax  and increased risk of falls. She doesn't feel like it's related at all but will be careful when she's taken a Xanax  and will use as sparingly as possible.   Continue Xanax  0.5 mg, 1 p.o. twice daily as needed. Cont Mirtazapine 15 mg, 1 at bedtime prn. Continue mirtazapine 15 mg, 1 p.o. nightly as needed sleep. Continue therapy with Dr. Ragan. Return in 2 months.   Verneita Cooks, PA-C

## 2023-10-31 DIAGNOSIS — L570 Actinic keratosis: Secondary | ICD-10-CM | POA: Diagnosis not present

## 2023-10-31 DIAGNOSIS — D225 Melanocytic nevi of trunk: Secondary | ICD-10-CM | POA: Diagnosis not present

## 2023-10-31 DIAGNOSIS — L821 Other seborrheic keratosis: Secondary | ICD-10-CM | POA: Diagnosis not present

## 2023-10-31 DIAGNOSIS — L814 Other melanin hyperpigmentation: Secondary | ICD-10-CM | POA: Diagnosis not present

## 2023-10-31 DIAGNOSIS — L578 Other skin changes due to chronic exposure to nonionizing radiation: Secondary | ICD-10-CM | POA: Diagnosis not present

## 2023-11-25 DIAGNOSIS — R5383 Other fatigue: Secondary | ICD-10-CM | POA: Diagnosis not present

## 2023-11-25 DIAGNOSIS — Z1212 Encounter for screening for malignant neoplasm of rectum: Secondary | ICD-10-CM | POA: Diagnosis not present

## 2023-11-25 DIAGNOSIS — I129 Hypertensive chronic kidney disease with stage 1 through stage 4 chronic kidney disease, or unspecified chronic kidney disease: Secondary | ICD-10-CM | POA: Diagnosis not present

## 2023-11-25 DIAGNOSIS — Z79899 Other long term (current) drug therapy: Secondary | ICD-10-CM | POA: Diagnosis not present

## 2023-11-25 DIAGNOSIS — R739 Hyperglycemia, unspecified: Secondary | ICD-10-CM | POA: Diagnosis not present

## 2023-11-25 DIAGNOSIS — E785 Hyperlipidemia, unspecified: Secondary | ICD-10-CM | POA: Diagnosis not present

## 2023-11-25 DIAGNOSIS — E7849 Other hyperlipidemia: Secondary | ICD-10-CM | POA: Diagnosis not present

## 2023-11-30 DIAGNOSIS — H8110 Benign paroxysmal vertigo, unspecified ear: Secondary | ICD-10-CM | POA: Diagnosis not present

## 2023-11-30 DIAGNOSIS — R739 Hyperglycemia, unspecified: Secondary | ICD-10-CM | POA: Diagnosis not present

## 2023-11-30 DIAGNOSIS — I1310 Hypertensive heart and chronic kidney disease without heart failure, with stage 1 through stage 4 chronic kidney disease, or unspecified chronic kidney disease: Secondary | ICD-10-CM | POA: Diagnosis not present

## 2023-11-30 DIAGNOSIS — Z0000 Encounter for general adult medical examination without abnormal findings: Secondary | ICD-10-CM | POA: Diagnosis not present

## 2023-11-30 DIAGNOSIS — R296 Repeated falls: Secondary | ICD-10-CM | POA: Diagnosis not present

## 2023-11-30 DIAGNOSIS — E669 Obesity, unspecified: Secondary | ICD-10-CM | POA: Diagnosis not present

## 2023-11-30 DIAGNOSIS — N182 Chronic kidney disease, stage 2 (mild): Secondary | ICD-10-CM | POA: Diagnosis not present

## 2023-11-30 DIAGNOSIS — E785 Hyperlipidemia, unspecified: Secondary | ICD-10-CM | POA: Diagnosis not present

## 2023-11-30 DIAGNOSIS — F439 Reaction to severe stress, unspecified: Secondary | ICD-10-CM | POA: Diagnosis not present

## 2023-12-02 ENCOUNTER — Other Ambulatory Visit: Payer: Self-pay | Admitting: Physician Assistant

## 2023-12-04 NOTE — Telephone Encounter (Signed)
 Not on med list, but no notation of D/C. Is she still taking?

## 2023-12-05 NOTE — Telephone Encounter (Signed)
 LVM to [facility]

## 2023-12-06 DIAGNOSIS — R296 Repeated falls: Secondary | ICD-10-CM | POA: Diagnosis not present

## 2023-12-06 DIAGNOSIS — M6281 Muscle weakness (generalized): Secondary | ICD-10-CM | POA: Diagnosis not present

## 2023-12-08 DIAGNOSIS — M6281 Muscle weakness (generalized): Secondary | ICD-10-CM | POA: Diagnosis not present

## 2023-12-08 DIAGNOSIS — R296 Repeated falls: Secondary | ICD-10-CM | POA: Diagnosis not present

## 2023-12-13 DIAGNOSIS — R296 Repeated falls: Secondary | ICD-10-CM | POA: Diagnosis not present

## 2023-12-13 DIAGNOSIS — M6281 Muscle weakness (generalized): Secondary | ICD-10-CM | POA: Diagnosis not present

## 2023-12-15 ENCOUNTER — Encounter: Payer: Self-pay | Admitting: Physician Assistant

## 2023-12-15 ENCOUNTER — Ambulatory Visit (INDEPENDENT_AMBULATORY_CARE_PROVIDER_SITE_OTHER): Admitting: Physician Assistant

## 2023-12-15 DIAGNOSIS — R296 Repeated falls: Secondary | ICD-10-CM | POA: Diagnosis not present

## 2023-12-15 DIAGNOSIS — F411 Generalized anxiety disorder: Secondary | ICD-10-CM

## 2023-12-15 DIAGNOSIS — F3342 Major depressive disorder, recurrent, in full remission: Secondary | ICD-10-CM | POA: Diagnosis not present

## 2023-12-15 DIAGNOSIS — F4310 Post-traumatic stress disorder, unspecified: Secondary | ICD-10-CM

## 2023-12-15 DIAGNOSIS — M6281 Muscle weakness (generalized): Secondary | ICD-10-CM | POA: Diagnosis not present

## 2023-12-15 MED ORDER — ESCITALOPRAM OXALATE 10 MG PO TABS: 10.0000 mg | ORAL_TABLET | Freq: Every day | ORAL | 3 refills | Status: AC

## 2023-12-15 MED ORDER — ALPRAZOLAM 0.5 MG PO TABS: 0.5000 mg | ORAL_TABLET | Freq: Two times a day (BID) | ORAL | 5 refills | Status: AC | PRN

## 2023-12-15 NOTE — Progress Notes (Signed)
 [facility]  Patient ID: Victoria Bradley,  MRN: [id number]  PCP: Onita Rush, MD  Date of Evaluation: 12/15/2023 Time spent:25 minutes  Chief Complaint:  Chief Complaint   Anxiety; Insomnia; Follow-up    HISTORY/CURRENT STATUS: HPI For 2 month med check.   Feels like her mental health is stable and meds are working well. She is able to enjoy things.  Energy and motivation are good.  No extreme sadness, tearfulness, or feelings of hopelessness.  Sleeps ok.  ADLs and personal hygiene are normal.  No change in memory.  Appetite has not changed.  Weight is stable.   Anxiety is controlled.  She occas takes the Xanax , but not at night.  No mania, delirium, AH/VH.  No SI/HI.  Individual Medical History/ Review of Systems: Changes? :Yes   tripped over a rug since her LOV and fell. This is 3rd time lately.  No injuries. In PT now.  States her balance is off.  She's seen her PCP about it.  She's not taking the Xanax  regularly and doesn't think it can be from that.   Past medications for mental health diagnoses include: Xanax  for dental work only, Mirtazapine to help appetite but she doesn't take it  No hosp for psych reasons  Allergies: Penicillins  Current Medications:  Current Outpatient Medications:    atenolol  (TENORMIN ) 25 MG tablet, TAKE 1 TABLET (25 MG TOTAL) BY MOUTH DAILY., Disp: 90 tablet, Rfl: 0   Cholecalciferol (VITAMIN D3) 100000 UNIT/GM POWD, Take 2 capsules po q daily, Disp: , Rfl:    mirtazapine (REMERON) 15 MG tablet, TAKE 1 TABLET BY MOUTH AT BEDTIME ORALLY AS DIRECTED 90 DAYS, Disp: , Rfl:    simvastatin (ZOCOR) 40 MG tablet, Take 1 tablet by mouth at bedtime., Disp: , Rfl:    valsartan  (DIOVAN ) 40 MG tablet, Take 1 tablet (40 mg total) by mouth every evening., Disp: 30 tablet, Rfl: 5   ALPRAZolam  (XANAX ) 0.5 MG tablet, Take 1 tablet (0.5 mg total) by mouth 2 (two) times daily as needed., Disp: 30 tablet, Rfl: 5   cetirizine (ZYRTEC) 10 MG tablet, Take 1 tablet  by mouth as needed. (Patient not taking: Reported on 12/15/2023), Disp: , Rfl:    escitalopram  (LEXAPRO ) 10 MG tablet, Take 1 tablet (10 mg total) by mouth daily., Disp: 90 tablet, Rfl: 3   fluticasone (FLONASE) 50 MCG/ACT nasal spray, Place 1 spray into both nostrils as needed. (Patient not taking: Reported on 12/15/2023), Disp: , Rfl:  Medication Side Effects: none  Family Medical/ Social History: Changes? No  MENTAL HEALTH EXAM:  There were no vitals taken for this visit.There is no height or weight on file to calculate BMI.  General Appearance: Casual, Well Groomed, and Obese  Eye Contact:  Good  Speech:  Clear and Coherent, Normal Rate, and talkative which is her normal  Volume:  Normal  Mood:  Euthymic  Affect:  Congruent  Thought Process:  Goal Directed and Descriptions of Associations: Circumstantial  Orientation:  Full (Time, Place, and Person)  Thought Content: Logical   Suicidal Thoughts:  No  Homicidal Thoughts:  No  Memory:  WNL  Judgement:  Good  Insight:  Good  Psychomotor Activity:  Normal  Concentration:  Concentration: Good  Recall:  Good  Fund of Knowledge: Good  Language: Good  Assets:  Communication Skills Desire for Improvement Financial Resources/Insurance Housing Resilience Transportation  ADL's:  Intact  Cognition: WNL  Prognosis:  Good   Labs from Dr. Rush Onita 11/25/2023  CBC WBC 4.8, H/H 12.3/35.5, plts 344 CMP Na 132, Glu 108, K+ 5.4 Lipids TC 179, Trig 55, HDL 97, LDL 71 TSH 3.73 Hgb A1C 5.3  DIAGNOSES:    ICD-10-CM   1. Recurrent major depression in full remission  F33.42     2. Generalized anxiety disorder  F41.1     3. PTSD (post-traumatic stress disorder)  F43.10      Receiving Psychotherapy: Yes   with Dr. Glean Major  RECOMMENDATIONS:   PDMP reviewed.  Xanax  filled 10/15/2023. I provided approximately  25 minutes of face to face time during this encounter, including time spent before and after the visit in records review,  medical decision making, counseling pertinent to today's visit, and charting.   She's doing well so no changes need to be made.   Continue Xanax  0.5 mg, 1 p.o. twice daily as needed. Continue mirtazapine 15 mg, 1 p.o. nightly as needed sleep. Continue therapy with Dr. Ragan. Return in 3-4 months.   Verneita Cooks, PA-C

## 2023-12-23 DIAGNOSIS — M6281 Muscle weakness (generalized): Secondary | ICD-10-CM | POA: Diagnosis not present

## 2023-12-23 DIAGNOSIS — R296 Repeated falls: Secondary | ICD-10-CM | POA: Diagnosis not present

## 2023-12-27 DIAGNOSIS — M6281 Muscle weakness (generalized): Secondary | ICD-10-CM | POA: Diagnosis not present

## 2023-12-27 DIAGNOSIS — R296 Repeated falls: Secondary | ICD-10-CM | POA: Diagnosis not present

## 2024-04-30 ENCOUNTER — Ambulatory Visit: Admitting: Physician Assistant
